# Patient Record
Sex: Female | Born: 1955 | Race: Black or African American | Hispanic: No
Health system: Midwestern US, Community
[De-identification: ages and names within clinical notes are randomized; demographics above are authoritative.]

## PROBLEM LIST (undated history)

## (undated) DIAGNOSIS — M1712 Unilateral primary osteoarthritis, left knee: Secondary | ICD-10-CM

## (undated) DIAGNOSIS — M25562 Pain in left knee: Secondary | ICD-10-CM

## (undated) DIAGNOSIS — G4730 Sleep apnea, unspecified: Secondary | ICD-10-CM

## (undated) DIAGNOSIS — R51 Headache: Secondary | ICD-10-CM

## (undated) DIAGNOSIS — M1990 Unspecified osteoarthritis, unspecified site: Secondary | ICD-10-CM

## (undated) DIAGNOSIS — G2581 Restless legs syndrome: Secondary | ICD-10-CM

## (undated) DIAGNOSIS — K219 Gastro-esophageal reflux disease without esophagitis: Secondary | ICD-10-CM

## (undated) DIAGNOSIS — K589 Irritable bowel syndrome without diarrhea: Secondary | ICD-10-CM

## (undated) DIAGNOSIS — K259 Gastric ulcer, unspecified as acute or chronic, without hemorrhage or perforation: Secondary | ICD-10-CM

## (undated) DIAGNOSIS — R519 Headache, unspecified: Secondary | ICD-10-CM

## (undated) HISTORY — PX: LAPAROSCOPIC OVARIAN CYSTECTOMY: SUR786

## (undated) HISTORY — PX: HEMORRHOID SURGERY: SHX153

## (undated) HISTORY — PX: DIAGNOSTIC LAPAROSCOPY: SUR761

## (undated) HISTORY — PX: TUBAL LIGATION: SHX77

## (undated) HISTORY — PX: JOINT REPLACEMENT: SHX530

## (undated) HISTORY — PX: DILATION AND CURETTAGE OF UTERUS: SHX78

## (undated) HISTORY — PX: KNEE ARTHROSCOPY: SHX127

---

## 2009-01-08 HISTORY — PX: GASTRIC BYPASS: SHX52

## 2012-05-10 HISTORY — PX: CHOLECYSTECTOMY: SHX55

## 2014-11-08 HISTORY — PX: SHOULDER ARTHROSCOPY W/ ROTATOR CUFF REPAIR: SHX2400

## 2015-03-04 ENCOUNTER — Emergency Department
Admit: 2015-03-04 | Disposition: A | Discharge status: Home / Self Care | Payer: Self-pay | Admitting: Emergency Medicine

## 2015-03-04 ENCOUNTER — Encounter: Payer: Self-pay | Admitting: Emergency Medicine

## 2015-03-04 LAB — CBC
HCT: 37.2 % (ref 35.0–47.0)
HGB: 12.4 g/dL (ref 12.0–16.0)
MCH: 31.2 pg (ref 26.0–34.0)
MCHC: 33.3 g/dL (ref 32.0–36.0)
MCV: 94 fL (ref 80–100)
PLATELETS: 209 10*3/uL (ref 150–440)
RBC: 3.97 10*6/uL (ref 3.80–5.20)
RDW: 13.8 % (ref 11.5–14.5)
WBC: 4.2 10*3/uL (ref 3.6–11.0)

## 2015-03-04 LAB — BASIC METABOLIC PANEL
Anion Gap: 6 — ABNORMAL LOW (ref 7–16)
BUN: 15 mg/dL
CHLORIDE: 110 mmol/L
CREATININE: 0.84 mg/dL
Calcium, Total: 8.9 mg/dL
Co2: 25 mmol/L
EGFR (African American): >60
Glucose: 81 mg/dL
Potassium: 3.8 mmol/L
Sodium: 141 mmol/L

## 2015-05-24 ENCOUNTER — Other Ambulatory Visit: Payer: Self-pay | Admitting: Neurology

## 2015-05-24 DIAGNOSIS — G35 Multiple sclerosis: Secondary | ICD-10-CM

## 2015-06-01 ENCOUNTER — Other Ambulatory Visit: Payer: Self-pay

## 2015-11-21 ENCOUNTER — Other Ambulatory Visit: Payer: Self-pay | Admitting: Internal Medicine

## 2015-11-21 ENCOUNTER — Ambulatory Visit
Admission: RE | Admit: 2015-11-21 | Discharge: 2015-11-21 | Disposition: A | Discharge status: Home / Self Care | Payer: Managed Care, Other (non HMO) | Source: Ambulatory Visit | Attending: Orthopedic Surgery | Admitting: Orthopedic Surgery

## 2015-11-21 ENCOUNTER — Encounter
Admission: RE | Admit: 2015-11-21 | Discharge: 2015-11-21 | Disposition: A | Discharge status: Home / Self Care | Payer: Managed Care, Other (non HMO) | Source: Ambulatory Visit | Attending: Orthopedic Surgery | Admitting: Orthopedic Surgery

## 2015-11-21 DIAGNOSIS — G4730 Sleep apnea, unspecified: Secondary | ICD-10-CM

## 2015-11-21 DIAGNOSIS — Z01818 Encounter for other preprocedural examination: Secondary | ICD-10-CM | POA: Diagnosis present

## 2015-11-21 DIAGNOSIS — Z01812 Encounter for preprocedural laboratory examination: Secondary | ICD-10-CM | POA: Insufficient documentation

## 2015-11-21 DIAGNOSIS — Z1239 Encounter for other screening for malignant neoplasm of breast: Secondary | ICD-10-CM

## 2015-11-21 DIAGNOSIS — Z9884 Bariatric surgery status: Secondary | ICD-10-CM | POA: Insufficient documentation

## 2015-11-21 HISTORY — DX: Sleep apnea, unspecified: G47.30

## 2015-11-21 HISTORY — DX: Gastric ulcer, unspecified as acute or chronic, without hemorrhage or perforation: K25.9

## 2015-11-21 HISTORY — DX: Irritable bowel syndrome, unspecified: K58.9

## 2015-11-21 HISTORY — DX: Headache, unspecified: R51.9

## 2015-11-21 HISTORY — DX: Unspecified osteoarthritis, unspecified site: M19.90

## 2015-11-21 HISTORY — DX: Gastro-esophageal reflux disease without esophagitis: K21.9

## 2015-11-21 HISTORY — DX: Headache: R51

## 2015-11-21 HISTORY — DX: Restless legs syndrome: G25.81

## 2015-11-21 LAB — URINALYSIS COMPLETE WITH MICROSCOPIC (ARMC ONLY)
Bilirubin Urine: NEGATIVE
GLUCOSE, UA: NEGATIVE mg/dL
KETONES UR: NEGATIVE mg/dL
NITRITE: NEGATIVE
Protein, ur: NEGATIVE mg/dL
SPECIFIC GRAVITY, URINE: 1.009 (ref 1.005–1.030)
pH: 6 (ref 5.0–8.0)

## 2015-11-21 LAB — CBC
HEMATOCRIT: 37.6 % (ref 35.0–47.0)
HEMOGLOBIN: 12.3 g/dL (ref 12.0–16.0)
MCH: 30.2 pg (ref 26.0–34.0)
MCHC: 32.7 g/dL (ref 32.0–36.0)
MCV: 92.5 fL (ref 80.0–100.0)
Platelets: 194 10*3/uL (ref 150–440)
RBC: 4.07 MIL/uL (ref 3.80–5.20)
RDW: 14.2 % (ref 11.5–14.5)
WBC: 4.3 10*3/uL (ref 3.6–11.0)

## 2015-11-21 LAB — BASIC METABOLIC PANEL
Anion gap: 7 (ref 5–15)
BUN: 13 mg/dL (ref 6–20)
CHLORIDE: 105 mmol/L (ref 101–111)
CO2: 30 mmol/L (ref 22–32)
CREATININE: 0.79 mg/dL (ref 0.44–1.00)
Calcium: 9.2 mg/dL (ref 8.9–10.3)
GFR calc Af Amer: >60 mL/min (ref >60)
GFR calc non Af Amer: >60 mL/min (ref >60)
GLUCOSE: 69 mg/dL (ref 65–99)
Potassium: 3.4 mmol/L — ABNORMAL LOW (ref 3.5–5.1)
SODIUM: 142 mmol/L (ref 135–145)

## 2015-11-21 LAB — TYPE AND SCREEN
ABO/RH(D): O POS
ANTIBODY SCREEN: NEGATIVE

## 2015-11-21 LAB — SURGICAL PCR SCREEN
MRSA, PCR: NEGATIVE
STAPHYLOCOCCUS AUREUS: POSITIVE — AB

## 2015-11-21 LAB — ABO/RH: ABO/RH(D): O POS

## 2015-11-21 LAB — PROTIME-INR
INR: 1.15
Prothrombin Time: 14.9 seconds (ref 11.4–15.0)

## 2015-11-21 LAB — APTT: aPTT: 27 seconds (ref 24–36)

## 2015-11-21 NOTE — Pre-Procedure Instructions (Signed)
AS INSTRUCTED BY DR Liliane Channel FOR MEDICAL CLEARANCE REGARDING EKG CALLED AND FAXED TO DR Martha Clan. LEFT MESSAGE FOR [facility] SURGERY COORDINATOR

## 2015-11-21 NOTE — Patient Instructions (Signed)
  Your procedure is scheduled on: November 29, 2015 (Thursday) Report to Day Surgery.[facility]) Second Floor  To find out your arrival time please call [phone] between 1PM - 3PM on November 28, 2015 (Wednesday).  Remember: Instructions that are not followed completely may result in serious medical risk, up to and including death, or upon the discretion of your surgeon and anesthesiologist your surgery may need to be rescheduled.    _x___ 1. Do not eat food or drink liquids after midnight. No gum chewing or hard candies.     ____ 2. No Alcohol for 24 hours before or after surgery.   ____ 3. Bring all medications with you on the day of surgery if instructed.    __x__ 4. Notify your doctor if there is any change in your medical condition     (cold, fever, infections).     Do not wear jewelry, make-up, hairpins, clips or nail polish.  Do not wear lotions, powders, or perfumes. You may wear deodorant.  Do not shave 48 hours prior to surgery. Men may shave face and neck.  Do not bring valuables to the hospital.    [facility] is not responsible for any belongings or valuables.               Contacts, dentures or bridgework may not be worn into surgery.  Leave your suitcase in the car. After surgery it may be brought to your room.  For patients admitted to the hospital, discharge time is determined by your                treatment team.   Patients discharged the day of surgery will not be allowed to drive home.   Please read over the following fact sheets that you were given:   MRSA Information and Surgical Site Infection Prevention   _x___ Take these medicines the morning of surgery with A SIP OF WATER:     1. Nexium (Nexium at bedtime on January 18)  2. Gabapentin  3.   4.  5.  6.  ____ Fleet Enema (as directed)   _x___ Use CHG Soap as directed  ____ Use inhalers on the day of surgery  ____ Stop metformin 2 days prior to surgery    ____ Take 1/2 of usual insulin  dose the night before surgery and none on the morning of surgery.   _x_ Stop Coumadin/Plavix/aspirin on (Stop Aspirin one week prior to surgery)  _x___ Stop Anti-inflammatories on (Tylenol ok to take for pain if needed)   __x__ Stop supplements until after surgery.  (Stop Vitamin B-12, and Biotin NOW)  ____ Bring C-Pap to the hospital.

## 2015-11-22 NOTE — Pre-Procedure Instructions (Signed)
Pt called to ask if she can continue taking her Tramadol or did she have to stop it prior to surgery.  This RN instructed pt can continue taking Tramadol, that it does not thin the blood.

## 2015-11-22 NOTE — Pre-Procedure Instructions (Signed)
Faxed results of UA and PCR screen to Dr. Martha Clan (office currently closed, no telephone notification)

## 2015-11-27 NOTE — Pre-Procedure Instructions (Signed)
CLEARED BY DR [facility] LOW RISK 11/26/15

## 2015-11-29 ENCOUNTER — Encounter: Payer: Self-pay | Admitting: *Deleted

## 2015-11-29 ENCOUNTER — Inpatient Hospital Stay
Admission: RE | Admit: 2015-11-29 | Discharge: 2015-12-03 | DRG: 470 | Disposition: A | Discharge status: Home / Self Care | Payer: Managed Care, Other (non HMO) | Source: Ambulatory Visit | Attending: Orthopedic Surgery | Admitting: Orthopedic Surgery

## 2015-11-29 ENCOUNTER — Encounter
Admission: RE | Disposition: A | Discharge status: Home / Self Care | Payer: Self-pay | Source: Ambulatory Visit | Attending: Orthopedic Surgery

## 2015-11-29 ENCOUNTER — Inpatient Hospital Stay: Payer: Managed Care, Other (non HMO)

## 2015-11-29 ENCOUNTER — Inpatient Hospital Stay: Payer: Managed Care, Other (non HMO) | Admitting: Anesthesiology

## 2015-11-29 DIAGNOSIS — Z96651 Presence of right artificial knee joint: Secondary | ICD-10-CM

## 2015-11-29 DIAGNOSIS — Z9884 Bariatric surgery status: Secondary | ICD-10-CM | POA: Diagnosis not present

## 2015-11-29 DIAGNOSIS — M1711 Unilateral primary osteoarthritis, right knee: Secondary | ICD-10-CM | POA: Diagnosis present

## 2015-11-29 DIAGNOSIS — G4730 Sleep apnea, unspecified: Secondary | ICD-10-CM | POA: Diagnosis present

## 2015-11-29 DIAGNOSIS — G2581 Restless legs syndrome: Secondary | ICD-10-CM | POA: Diagnosis present

## 2015-11-29 DIAGNOSIS — M170 Bilateral primary osteoarthritis of knee: Secondary | ICD-10-CM | POA: Diagnosis present

## 2015-11-29 DIAGNOSIS — K219 Gastro-esophageal reflux disease without esophagitis: Secondary | ICD-10-CM | POA: Diagnosis present

## 2015-11-29 DIAGNOSIS — Z8711 Personal history of peptic ulcer disease: Secondary | ICD-10-CM | POA: Diagnosis not present

## 2015-11-29 DIAGNOSIS — K589 Irritable bowel syndrome without diarrhea: Secondary | ICD-10-CM | POA: Diagnosis present

## 2015-11-29 HISTORY — PX: TOTAL KNEE ARTHROPLASTY: SHX125

## 2015-11-29 SURGERY — ARTHROPLASTY, KNEE, TOTAL
Anesthesia: Spinal | Site: Knee | Laterality: Right | Wound class: Clean

## 2015-11-29 MED ORDER — TOPIRAMATE 25 MG PO TABS
25.0000 mg | ORAL_TABLET | Freq: Two times a day (BID) | ORAL | Status: DC
  Administered 2015-11-29 – 2015-12-03 (×9): 25 mg via ORAL
  Filled 2015-11-29 (×10): qty 1

## 2015-11-29 MED ORDER — FENTANYL CITRATE (PF) 100 MCG/2ML IJ SOLN
INTRAMUSCULAR | Status: DC | PRN
  Administered 2015-11-29 (×2): 50 ug via INTRAVENOUS

## 2015-11-29 MED ORDER — SODIUM CHLORIDE 0.9 % IJ SOLN
INTRAMUSCULAR | Status: AC
  Filled 2015-11-29: qty 3

## 2015-11-29 MED ORDER — LACTATED RINGERS IV SOLN
INTRAVENOUS | Status: DC
  Administered 2015-11-29 (×3): via INTRAVENOUS

## 2015-11-29 MED ORDER — HYDROMORPHONE HCL 1 MG/ML IJ SOLN
1.0000 mg | INTRAMUSCULAR | Status: DC | PRN
  Administered 2015-11-30: 1 mg via INTRAVENOUS
  Filled 2015-11-29: qty 1

## 2015-11-29 MED ORDER — BUPIVACAINE-EPINEPHRINE (PF) 0.25% -1:200000 IJ SOLN
INTRAMUSCULAR | Status: AC
  Filled 2015-11-29: qty 30

## 2015-11-29 MED ORDER — BIOTIN 5000 MCG SL SUBL: 5000.0000 ug | SUBLINGUAL_TABLET | Freq: Every day | SUBLINGUAL | Status: DC

## 2015-11-29 MED ORDER — PHENOL 1.4 % MT LIQD: 1.0000 | OROMUCOSAL | Status: DC | PRN

## 2015-11-29 MED ORDER — VITAMIN B-12 1000 MCG PO TABS
1000.0000 ug | ORAL_TABLET | Freq: Every day | ORAL | Status: DC
  Administered 2015-11-30 – 2015-12-03 (×4): 1000 ug via ORAL
  Filled 2015-11-29 (×4): qty 1

## 2015-11-29 MED ORDER — PHENYLEPHRINE HCL 10 MG/ML IJ SOLN
INTRAMUSCULAR | Status: DC | PRN
  Administered 2015-11-29 (×4): 100 ug via INTRAVENOUS

## 2015-11-29 MED ORDER — PROPOFOL 500 MG/50ML IV EMUL
INTRAVENOUS | Status: DC | PRN
  Administered 2015-11-29: 75 ug/kg/min via INTRAVENOUS

## 2015-11-29 MED ORDER — METHOCARBAMOL 500 MG PO TABS
500.0000 mg | ORAL_TABLET | Freq: Four times a day (QID) | ORAL | Status: DC | PRN
  Administered 2015-11-29 – 2015-12-03 (×9): 500 mg via ORAL
  Filled 2015-11-29 (×10): qty 1

## 2015-11-29 MED ORDER — BUPIVACAINE HCL (PF) 0.5 % IJ SOLN
INTRAMUSCULAR | Status: DC | PRN
  Administered 2015-11-29: 2.5 mL

## 2015-11-29 MED ORDER — ALUM & MAG HYDROXIDE-SIMETH 200-200-20 MG/5ML PO SUSP: 30.0000 mL | ORAL | Status: DC | PRN

## 2015-11-29 MED ORDER — ONDANSETRON HCL 4 MG/2ML IJ SOLN: 4.0000 mg | Freq: Four times a day (QID) | INTRAMUSCULAR | Status: DC | PRN

## 2015-11-29 MED ORDER — NEOMYCIN-POLYMYXIN B GU 40-200000 IR SOLN
Status: AC
  Filled 2015-11-29: qty 20

## 2015-11-29 MED ORDER — METOCLOPRAMIDE HCL 5 MG/ML IJ SOLN: 5.0000 mg | Freq: Three times a day (TID) | INTRAMUSCULAR | Status: DC | PRN

## 2015-11-29 MED ORDER — OXYCODONE HCL 5 MG PO TABS
10.0000 mg | ORAL_TABLET | ORAL | Status: DC | PRN
  Administered 2015-11-29 – 2015-11-30 (×6): 10 mg via ORAL
  Filled 2015-11-29 (×7): qty 2

## 2015-11-29 MED ORDER — CEFAZOLIN SODIUM-DEXTROSE 2-3 GM-% IV SOLR
2.0000 g | Freq: Once | INTRAVENOUS | Status: AC
  Administered 2015-11-29: 2 g via INTRAVENOUS

## 2015-11-29 MED ORDER — SODIUM CHLORIDE 0.9 % IV SOLN
Freq: Once | INTRAVENOUS | Status: AC
  Administered 2015-11-29: 14:00:00 via INTRAVENOUS

## 2015-11-29 MED ORDER — CEFAZOLIN SODIUM-DEXTROSE 2-3 GM-% IV SOLR
INTRAVENOUS | Status: AC
  Filled 2015-11-29: qty 50

## 2015-11-29 MED ORDER — ONDANSETRON HCL 4 MG PO TABS: 4.0000 mg | ORAL_TABLET | Freq: Four times a day (QID) | ORAL | Status: DC | PRN

## 2015-11-29 MED ORDER — MIDAZOLAM HCL 5 MG/5ML IJ SOLN
INTRAMUSCULAR | Status: DC | PRN
  Administered 2015-11-29: 0.5 mg via INTRAVENOUS
  Administered 2015-11-29: 1 mg via INTRAVENOUS
  Administered 2015-11-29: 0.5 mg via INTRAVENOUS

## 2015-11-29 MED ORDER — ACETAMINOPHEN 325 MG PO TABS: 650.0000 mg | ORAL_TABLET | Freq: Four times a day (QID) | ORAL | Status: DC | PRN

## 2015-11-29 MED ORDER — SODIUM CHLORIDE 0.9 % IJ SOLN
INTRAMUSCULAR | Status: AC
  Filled 2015-11-29: qty 50

## 2015-11-29 MED ORDER — VITAMIN B-12 5000 MCG SL SUBL: 5000.0000 ug | SUBLINGUAL_TABLET | Freq: Every day | SUBLINGUAL | Status: DC

## 2015-11-29 MED ORDER — DIPHENHYDRAMINE HCL 12.5 MG/5ML PO ELIX: 12.5000 mg | ORAL_SOLUTION | ORAL | Status: DC | PRN

## 2015-11-29 MED ORDER — SODIUM CHLORIDE 0.9 % IV SOLN
INTRAVENOUS | Status: DC | PRN
  Administered 2015-11-29: 120 mL

## 2015-11-29 MED ORDER — GABAPENTIN 600 MG PO TABS
600.0000 mg | ORAL_TABLET | Freq: Three times a day (TID) | ORAL | Status: DC
  Administered 2015-11-29 – 2015-12-03 (×12): 600 mg via ORAL
  Filled 2015-11-29 (×12): qty 1

## 2015-11-29 MED ORDER — METOCLOPRAMIDE HCL 5 MG PO TABS: 5.0000 mg | ORAL_TABLET | Freq: Three times a day (TID) | ORAL | Status: DC | PRN

## 2015-11-29 MED ORDER — SUMATRIPTAN SUCCINATE 50 MG PO TABS
100.0000 mg | ORAL_TABLET | ORAL | Status: DC | PRN
  Administered 2015-12-02: 100 mg via ORAL
  Filled 2015-11-29: qty 2

## 2015-11-29 MED ORDER — BUPIVACAINE LIPOSOME 1.3 % IJ SUSP
INTRAMUSCULAR | Status: AC
  Filled 2015-11-29: qty 20

## 2015-11-29 MED ORDER — BISACODYL 5 MG PO TBEC
5.0000 mg | DELAYED_RELEASE_TABLET | Freq: Every day | ORAL | Status: DC | PRN
  Administered 2015-12-01: 5 mg via ORAL
  Filled 2015-11-29: qty 1

## 2015-11-29 MED ORDER — MENTHOL 3 MG MT LOZG: 1.0000 | LOZENGE | OROMUCOSAL | Status: DC | PRN

## 2015-11-29 MED ORDER — CLINDAMYCIN PHOSPHATE 600 MG/50ML IV SOLN: 600.0000 mg | Freq: Once | INTRAVENOUS | Status: DC

## 2015-11-29 MED ORDER — METHOCARBAMOL 1000 MG/10ML IJ SOLN
500.0000 mg | Freq: Four times a day (QID) | INTRAVENOUS | Status: DC | PRN
  Filled 2015-11-29: qty 5

## 2015-11-29 MED ORDER — DOCUSATE SODIUM 100 MG PO CAPS
100.0000 mg | ORAL_CAPSULE | Freq: Two times a day (BID) | ORAL | Status: DC
  Administered 2015-11-29 – 2015-12-03 (×8): 100 mg via ORAL
  Filled 2015-11-29 (×8): qty 1

## 2015-11-29 MED ORDER — SODIUM CHLORIDE 0.9 % IV SOLN
INTRAVENOUS | Status: DC
  Administered 2015-11-29: 15:00:00 via INTRAVENOUS

## 2015-11-29 MED ORDER — MIRABEGRON ER 25 MG PO TB24: 50.0000 mg | ORAL_TABLET | ORAL | Status: DC | PRN

## 2015-11-29 MED ORDER — ACETAMINOPHEN 650 MG RE SUPP: 650.0000 mg | Freq: Four times a day (QID) | RECTAL | Status: DC | PRN

## 2015-11-29 MED ORDER — CELECOXIB 200 MG PO CAPS
200.0000 mg | ORAL_CAPSULE | Freq: Two times a day (BID) | ORAL | Status: DC
  Administered 2015-11-29: 200 mg via ORAL
  Filled 2015-11-29: qty 1

## 2015-11-29 MED ORDER — ONDANSETRON HCL 4 MG/2ML IJ SOLN
INTRAMUSCULAR | Status: DC | PRN
  Administered 2015-11-29: 4 mg via INTRAVENOUS

## 2015-11-29 MED ORDER — MUPIROCIN 2 % EX OINT
TOPICAL_OINTMENT | Freq: Two times a day (BID) | CUTANEOUS | Status: DC
  Administered 2015-11-29 – 2015-12-03 (×9): via NASAL
  Filled 2015-11-29: qty 22

## 2015-11-29 MED ORDER — ASPIRIN EC 81 MG PO TBEC
81.0000 mg | DELAYED_RELEASE_TABLET | Freq: Every day | ORAL | Status: DC
  Administered 2015-11-30 – 2015-12-03 (×4): 81 mg via ORAL
  Filled 2015-11-29 (×4): qty 1

## 2015-11-29 MED ORDER — TETRACAINE HCL 1 % IJ SOLN
INTRAMUSCULAR | Status: DC | PRN
  Administered 2015-11-29: 2.5 mg via INTRASPINAL

## 2015-11-29 MED ORDER — MORPHINE SULFATE (PF) 4 MG/ML IV SOLN
INTRAVENOUS | Status: AC
  Filled 2015-11-29: qty 1

## 2015-11-29 MED ORDER — ENOXAPARIN SODIUM 30 MG/0.3ML ~~LOC~~ SOLN
30.0000 mg | Freq: Two times a day (BID) | SUBCUTANEOUS | Status: DC
  Administered 2015-11-30 – 2015-12-03 (×7): 30 mg via SUBCUTANEOUS
  Filled 2015-11-29 (×8): qty 0.3

## 2015-11-29 MED ORDER — FENTANYL CITRATE (PF) 100 MCG/2ML IJ SOLN: 25.0000 ug | INTRAMUSCULAR | Status: DC | PRN

## 2015-11-29 MED ORDER — LIDOCAINE HCL (PF) 1 % IJ SOLN
INTRAMUSCULAR | Status: DC | PRN
  Administered 2015-11-29: 30 mg

## 2015-11-29 MED ORDER — ACETAMINOPHEN 10 MG/ML IV SOLN
INTRAVENOUS | Status: AC
  Filled 2015-11-29: qty 100

## 2015-11-29 MED ORDER — VITAMIN D 1000 UNITS PO TABS
1000.0000 [IU] | ORAL_TABLET | Freq: Every day | ORAL | Status: DC
  Administered 2015-11-30 – 2015-12-03 (×4): 1000 [IU] via ORAL
  Filled 2015-11-29 (×4): qty 1

## 2015-11-29 MED ORDER — ACETAMINOPHEN 10 MG/ML IV SOLN
INTRAVENOUS | Status: DC | PRN
  Administered 2015-11-29: 1000 mg via INTRAVENOUS

## 2015-11-29 MED ORDER — FLUTICASONE PROPIONATE 50 MCG/ACT NA SUSP
2.0000 | Freq: Every day | NASAL | Status: DC
  Filled 2015-11-29: qty 16

## 2015-11-29 MED ORDER — ONDANSETRON HCL 4 MG/2ML IJ SOLN: 4.0000 mg | Freq: Once | INTRAMUSCULAR | Status: DC | PRN

## 2015-11-29 MED ORDER — CLINDAMYCIN PHOSPHATE 600 MG/50ML IV SOLN
INTRAVENOUS | Status: AC
  Administered 2015-11-29: 600 mg via INTRAVENOUS
  Filled 2015-11-29: qty 50

## 2015-11-29 MED ORDER — MAGNESIUM HYDROXIDE 400 MG/5ML PO SUSP
30.0000 mL | Freq: Every day | ORAL | Status: DC | PRN
  Administered 2015-11-30: 30 mL via ORAL
  Filled 2015-11-29: qty 30

## 2015-11-29 MED ORDER — LIDOCAINE HCL 2 % EX GEL
CUTANEOUS | Status: DC | PRN
  Administered 2015-11-29: 1 via TOPICAL

## 2015-11-29 MED ORDER — KETOROLAC TROMETHAMINE 30 MG/ML IJ SOLN
INTRAMUSCULAR | Status: DC | PRN
  Administered 2015-11-29: 30 mg via INTRAVENOUS

## 2015-11-29 MED ORDER — CEFAZOLIN SODIUM-DEXTROSE 2-3 GM-% IV SOLR
2.0000 g | Freq: Four times a day (QID) | INTRAVENOUS | Status: AC
  Administered 2015-11-29 (×2): 2 g via INTRAVENOUS
  Filled 2015-11-29 (×2): qty 50

## 2015-11-29 MED ORDER — NEOMYCIN-POLYMYXIN B GU 40-200000 IR SOLN
Status: DC | PRN
  Administered 2015-11-29: 1 mL

## 2015-11-29 MED ORDER — FLEET ENEMA 7-19 GM/118ML RE ENEM
1.0000 | ENEMA | Freq: Once | RECTAL | Status: AC | PRN
  Administered 2015-12-02: 1 via RECTAL

## 2015-11-29 MED ORDER — EPHEDRINE SULFATE 50 MG/ML IJ SOLN
INTRAMUSCULAR | Status: DC | PRN
  Administered 2015-11-29: 5 mg via INTRAVENOUS

## 2015-11-29 MED ORDER — PANTOPRAZOLE SODIUM 40 MG PO TBEC
40.0000 mg | DELAYED_RELEASE_TABLET | Freq: Every day | ORAL | Status: DC
  Administered 2015-11-29 – 2015-12-03 (×5): 40 mg via ORAL
  Filled 2015-11-29 (×5): qty 1

## 2015-11-29 SURGICAL SUPPLY — 62 items
AUTOTRANSFUS HAS 1/8 (MISCELLANEOUS) ×3
BAG DECANTER FOR FLEXI CONT (MISCELLANEOUS) ×3 IMPLANT
BLADE DEBAKEY 8.0 (BLADE) ×2 IMPLANT
BLADE DEBAKEY 8.0MM (BLADE) ×1
BLADE SAW 1 (BLADE) ×3 IMPLANT
BLADE SAW 1/2 (BLADE) ×3 IMPLANT
BLADE SURG 15 STRL LF DISP TIS (BLADE) ×1 IMPLANT
BLADE SURG 15 STRL SS (BLADE) ×2
CANISTER SUCT 1200ML W/VALVE (MISCELLANEOUS) ×6 IMPLANT
CAP KNEE TOTAL 3 SIGMA ×3 IMPLANT
CATH TRAY METER 16FR LF (MISCELLANEOUS) ×3 IMPLANT
CEMENT HV SMART SET (Cement) ×6 IMPLANT
CLOSURE WOUND 1/2 X4 (GAUZE/BANDAGES/DRESSINGS) ×2
COOLER POLAR GLACIER W/PUMP (MISCELLANEOUS) ×3 IMPLANT
DRAPE IMP U-DRAPE 54X76 (DRAPES) ×3 IMPLANT
DRAPE INCISE IOBAN 66X60 STRL (DRAPES) ×3 IMPLANT
DRAPE SHEET LG 3/4 BI-LAMINATE (DRAPES) ×3 IMPLANT
DRAPE SURG 17X11 SM STRL (DRAPES) ×6 IMPLANT
DRSG OPSITE POSTOP 4X12 (GAUZE/BANDAGES/DRESSINGS) ×6 IMPLANT
DRSG OPSITE POSTOP 4X14 (GAUZE/BANDAGES/DRESSINGS) ×3 IMPLANT
DURAPREP 26ML APPLICATOR (WOUND CARE) ×9 IMPLANT
ELECT REM PT RETURN 9FT ADLT (ELECTROSURGICAL) ×3
ELECTRODE REM PT RTRN 9FT ADLT (ELECTROSURGICAL) ×1 IMPLANT
GAUZE PETRO XEROFOAM 1X8 (MISCELLANEOUS) ×6 IMPLANT
GAUZE SPONGE 4X4 12PLY STRL (GAUZE/BANDAGES/DRESSINGS) ×3 IMPLANT
GLOVE BIOGEL PI IND STRL 9 (GLOVE) ×1 IMPLANT
GLOVE BIOGEL PI INDICATOR 9 (GLOVE) ×2
GLOVE SURG 9.0 ORTHO LTXF (GLOVE) ×9 IMPLANT
GOWN STRL REUS TWL 2XL XL LVL4 (GOWN DISPOSABLE) ×3 IMPLANT
GOWN STRL REUS W/ TWL LRG LVL3 (GOWN DISPOSABLE) ×4 IMPLANT
GOWN STRL REUS W/TWL LRG LVL3 (GOWN DISPOSABLE) ×8
GOWN STRL REUS W/TWL XL LVL4 (GOWN DISPOSABLE) ×3 IMPLANT
HANDPIECE SUCTION TUBG SURGILV (MISCELLANEOUS) ×3 IMPLANT
IMMBOLIZER KNEE 19 BLUE UNIV (SOFTGOODS) ×3 IMPLANT
IV NS 100ML SINGLE PACK (IV SOLUTION) ×3 IMPLANT
KIT RM TURNOVER STRD PROC AR (KITS) ×3 IMPLANT
NDL SAFETY 18GX1.5 (NEEDLE) ×3 IMPLANT
NDL SAFETY 22GX1.5 (NEEDLE) ×3 IMPLANT
NEEDLE SPNL 20GX3.5 QUINCKE YW (NEEDLE) ×3 IMPLANT
NS IRRIG 1000ML POUR BTL (IV SOLUTION) ×3 IMPLANT
PACK TOTAL KNEE (MISCELLANEOUS) ×3 IMPLANT
PAD PREP 24X41 OB/GYN DISP (PERSONAL CARE ITEMS) ×3 IMPLANT
PAD WRAPON POLAR KNEE (MISCELLANEOUS) ×1 IMPLANT
SOL .9 NS 3000ML IRR  AL (IV SOLUTION) ×2
SOL .9 NS 3000ML IRR UROMATIC (IV SOLUTION) ×1 IMPLANT
SPONGE DRAIN TRACH 4X4 STRL 2S (GAUZE/BANDAGES/DRESSINGS) ×3 IMPLANT
SPONGE LAP 18X18 5 PK (GAUZE/BANDAGES/DRESSINGS) IMPLANT
STAPLER SKIN PROX 35W (STAPLE) ×3 IMPLANT
STRIP CLOSURE SKIN 1/2X4 (GAUZE/BANDAGES/DRESSINGS) ×4 IMPLANT
SUCTION FRAZIER HANDLE 10FR (MISCELLANEOUS) ×2
SUCTION TUBE FRAZIER 10FR DISP (MISCELLANEOUS) ×1 IMPLANT
SUT ETHIBOND NAB CT1 #1 30IN (SUTURE) ×6 IMPLANT
SUT MNCRL AB 3-0 PS2 27 (SUTURE) ×6 IMPLANT
SUT VIC AB 0 CT1 36 (SUTURE) ×3 IMPLANT
SUT VIC AB 2-0 CT1 (SUTURE) ×6 IMPLANT
SYR 20CC LL (SYRINGE) ×3 IMPLANT
SYR 30ML LL (SYRINGE) ×3 IMPLANT
SYR 50ML LL SCALE MARK (SYRINGE) ×3 IMPLANT
SYSTEM AUTOTRANSFUS DUAL TROCR (MISCELLANEOUS) ×1 IMPLANT
TOWER CARTRIDGE SMART MIX (DISPOSABLE) ×3 IMPLANT
TUBE SUCT KAM VAC (TUBING) ×3 IMPLANT
WRAPON POLAR PAD KNEE (MISCELLANEOUS) ×3

## 2015-11-29 NOTE — Evaluation (Signed)
Physical Therapy Evaluation Patient Details Name: Deborah Reynolds MRN: [id number] DOB: 20-Apr-1956 Today's Date: 11/29/2015   History of Present Illness  Pt underwent R TKR without reported post-op complications. She is POD#0 at time of evaluation. No reported falls in the last 12 months.  Clinical Impression  Pt demonstrates good strength and stability with physical therapy for POD#0. AAOM is also good for day of surgery as well. Pt demonstrates good strength with bed mobility, transfers, and limited ambulation. She is able to transfer from bed to recliner with min guard only. Good sequencing with walker and pt able to maintain PWB restrictions. Pt will benefit from skilled PT services to address deficits in strength, balance, and mobility in order to return to full function at home.     Follow Up Recommendations Home health PT    Equipment Recommendations  Rolling walker with 5" wheels    Recommendations for Other Services       Precautions / Restrictions Precautions Precautions: Knee Precaution Booklet Issued: Yes (comment) Restrictions Weight Bearing Restrictions: Yes RLE Weight Bearing: Partial weight bearing      Mobility  Bed Mobility Overal bed mobility: Needs Assistance Bed Mobility: Supine to Sit     Supine to sit: Min assist     General bed mobility comments: Pt requires minA for UE support when moving to sitting upright. Pt able to scoot up to EOB without assist. Good sequencing noted  Transfers Overall transfer level: Needs assistance Equipment used: Rolling walker (2 wheeled) Transfers: Sit to/from Stand Sit to Stand: Min guard         General transfer comment: Pt with decreased weight shift to RLE during transfer. Increased pain but able to come to upright standing without assist from therapist. Once upright cues provided for full hip extension in standing. Pt able to increase weight shift to RLE once standing. Able to maintain PWB  status  Ambulation/Gait Ambulation/Gait assistance: Min guard Ambulation Distance (Feet): 3 Feet Assistive device: Rolling walker (2 wheeled) Gait Pattern/deviations: Step-to pattern;Decreased stance time - right;Decreased weight shift to right Gait velocity: Decreased Gait velocity interpretation: <1.8 ft/sec, indicative of risk for recurrent falls General Gait Details: Pt provided education regarding proper sequencing with walker and PWB status. Pt with decreased weight shift to RLE but good stability and strength noted. Pt reports increased pain in standing on RLe  Stairs            Wheelchair Mobility    Modified Rankin (Stroke Patients Only)       Balance Overall balance assessment: Needs assistance   Sitting balance-Leahy Scale: Normal       Standing balance-Leahy Scale: Fair                               Pertinent Vitals/Pain Pain Assessment: 0-10 Pain Score: 8  Pain Location: R knee Pain Descriptors / Indicators: Throbbing Pain Intervention(s): Limited activity within patient's tolerance;Monitored during session;Premedicated before session    Home Living Family/patient expects to be discharged to:: Private residence Living Arrangements: Children Available Help at Discharge: Family Type of Home: Apartment Home Access: Stairs to enter Entrance Stairs-Rails: None Entrance Stairs-Number of Steps: 4 Home Layout: One level Home Equipment: Cane - quad;Grab bars - tub/shower;Grab bars - toilet      Prior Function Level of Independence: Independent         Comments: Limited community ambulator with mult-point cane     Hand Dominance  Dominant Hand: Right    Extremity/Trunk Assessment   Upper Extremity Assessment: Overall WFL for tasks assessed           Lower Extremity Assessment: RLE deficits/detail RLE Deficits / Details: Pt demonstrates independent SLR and SAQ on RLE without assist. Full DF/PF. Pt reports full sensation in  RLE. LLE strength is grossly WFL       Communication   Communication: No difficulties  Cognition Arousal/Alertness: Awake/alert Behavior During Therapy: WFL for tasks assessed/performed Overall Cognitive Status: Within Functional Limits for tasks assessed                      General Comments      Exercises Total Joint Exercises Ankle Circles/Pumps: Strengthening;Both;10 reps;Supine Quad Sets: Strengthening;Both;10 reps;Supine Gluteal Sets: Strengthening;Both;10 reps;Supine Towel Squeeze: Strengthening;Both;10 reps;Supine Short Arc Quad: Strengthening;Right;10 reps;Supine Heel Slides: Strengthening;Right;10 reps;Supine Hip ABduction/ADduction: Strengthening;Right;10 reps;Supine Straight Leg Raises: Strengthening;Right;10 reps;Supine Goniometric ROM: -7-72 degrees AAROM, pain limited      Assessment/Plan    PT Assessment Patient needs continued PT services  PT Diagnosis Difficulty walking;Abnormality of gait;Generalized weakness;Acute pain   PT Problem List Decreased strength;Decreased range of motion;Decreased balance;Decreased mobility;Decreased knowledge of use of DME;Pain;Obesity  PT Treatment Interventions     PT Goals (Current goals can be found in the Care Plan section) Acute Rehab PT Goals Patient Stated Goal: Improve functioning with less pain PT Goal Formulation: With patient Time For Goal Achievement: 12/13/15 Potential to Achieve Goals: Good    Frequency BID   Barriers to discharge Inaccessible home environment 4 steps to enter, no rails    Co-evaluation               End of Session Equipment Utilized During Treatment: Gait belt Activity Tolerance: Patient tolerated treatment well Patient left: in chair;with call bell/phone within reach;with chair alarm set;with SCD's reapplied (towel roll under heel, polar care in place) Nurse Communication: Mobility status         Time: 0045-9977 PT Time Calculation (min) (ACUTE ONLY): 26  min   Charges:   PT Evaluation $PT Eval Low Complexity: 1 Procedure PT Treatments $Therapeutic Exercise: 8-22 mins   PT G Codes:       Sharalyn Ink Debborah Alonge PT, DPT   Nimah Uphoff 11/29/2015, 5:13 PM

## 2015-11-29 NOTE — Progress Notes (Signed)
  Subjective:  POST-OP CHECK:  Patient reports right knee pain as marked.  Patient recently given oxycodone for pain.    Objective:   VITALS:   Filed Vitals:   11/29/15 1451 11/29/15 1519 11/29/15 1550 11/29/15 1707  BP: 92/77 120/63 111/71 109/69  Pulse: 74 66 67 72  Temp: 96.8 F (36 C) 97.4 F (36.3 C) 97.4 F (36.3 C) 97.9 F (36.6 C)  TempSrc: Axillary Oral Oral Oral  Resp: 16  18 18   Height:      Weight:      SpO2: 100% 100% 100% 100%    PHYSICAL EXAM: Patient lying in bed. HOB at 30 degrees. No acute distress. Right knee exam: Neurologically intact Neurovascular intact Sensation intact distally Intact pulses distally Dorsiflexion/Plantar flexion intact Compartment soft  LABS  No results found for this or any previous visit (from the past 24 hour(s)).  Dg Knee 1-2 Views Right  11/29/2015  CLINICAL DATA:  Status post total knee replacement EXAM: RIGHT KNEE - 1-2 VIEW COMPARISON:  None. FINDINGS: Frontal and lateral views were obtained. The patient is status post total knee replacement right with femoral and tibial prosthetic components appearing well-seated. No fracture or dislocation. Drains are present within the knee joint. Soft tissue air is an expected postoperative finding. IMPRESSION: Prosthetic components appear well seated. No acute fracture or dislocation. Electronically Signed   By: Bretta Bang III M.D.   On: 11/29/2015 11:54    Assessment/Plan: Day of Surgery   Active Problems:   History of total right knee replacement  Patient stable post-op.  RN reports patient's pain well controlled this afternoon.  Patient to complete 24 hours of post-op antibiotics.  Lovenox to start tomorrow.  Encourage IS.  I have reviewed the post-op xrays which demonstrate components well-positioned and no evidence of fracture or dislocation.  PT to start in AM.  CPM to start this evening.    Juanell Fairly , MD 11/29/2015, 5:44 PM

## 2015-11-29 NOTE — Care Management (Addendum)
Admitted to this facility post Total Knee Replacement today 11/29/15. Lives with son, Florentina Jenny, [phone]). Last seen Dr. Leotis Shames last week.  Did attend Joint Classes. No home Health in the past. No skilled nursing facility. Does have a cane in the home. Would like a rolling walker. Would like to go home when stable. No Home Health preference. Takes care of all basic and instrumental activities of daily living herself, drives. Pharmacy is located on [address] in [place]. Physical therapy evaluation pending. Gwenette Greet RN MSN CCM Care Management [phone]

## 2015-11-29 NOTE — Transfer of Care (Signed)
Immediate Anesthesia Transfer of Care Note  Patient: Deborah Reynolds  Procedure(s) Performed: Procedure(s): TOTAL KNEE ARTHROPLASTY (Right)  Patient Location: PACU  Anesthesia Type:Spinal  Level of Consciousness: awake, oriented and patient cooperative  Airway & Oxygen Therapy: Patient Spontanous Breathing and Patient connected to face mask oxygen  Post-op Assessment: Report given to RN and Post -op Vital signs reviewed and stable  Post vital signs: Reviewed and stable  Last Vitals:  Filed Vitals:   11/29/15 0627  BP: 130/83  Pulse: 73  Temp: 36.4 C  Resp: 14    Complications: No apparent anesthesia complications

## 2015-11-29 NOTE — Progress Notes (Signed)
RN SPOKE WITH DR Martha Clan . PT DOES NOT WANT TO TAKE CELEBREX D/T BLEEDING TENDENCIES. MD REPORTS D/C MED

## 2015-11-29 NOTE — Anesthesia Procedure Notes (Signed)
Spinal Patient location during procedure: OR Staffing Anesthesiologist: Alvin Critchley Resident/CRNA: Raynaldo Falco, Marcello Moores Performed by: anesthesiologist  Preanesthetic Checklist Completed: patient identified, site marked, surgical consent, pre-op evaluation, timeout performed, IV checked, risks and benefits discussed and monitors and equipment checked Spinal Block Patient position: sitting Prep: Betadine and site prepped and draped Patient monitoring: heart rate, continuous pulse ox, blood pressure and cardiac monitor Approach: midline Location: L4-5 Injection technique: single-shot Needle Needle type: Introducer and Quincke  Needle gauge: 25 G Needle length: 9 cm Additional Notes Negative paresthesia. Negative blood return. Positive free-flowing CSF. Expiration date of kit checked and confirmed. Patient tolerated procedure well, without complications.

## 2015-11-29 NOTE — H&P (Signed)
The patient has been re-examined, and the chart reviewed, and there have been no interval changes to the documented history and physical.    The risks, benefits, and alternatives have been discussed at length, and the patient is willing to proceed.   

## 2015-11-29 NOTE — Op Note (Signed)
DATE OF SURGERY:  11/29/2015 TIME: 10:37 AM  PATIENT NAME:  Deborah Reynolds   AGE: 60 y.o.    PRE-OPERATIVE DIAGNOSIS:  RIGHT PRIMARY OSTEOARTHRITIS  POST-OPERATIVE DIAGNOSIS:  Same  PROCEDURE:  Procedure(s): RIGHT TOTAL KNEE ARTHROPLASTY  SURGEON:  Juanell Fairly, MD   ASSISTANT:  Deeann Saint, MD                        Jamison Neighbor, [place] PA Student  OPERATIVE IMPLANTS: Depuy PFC Sigma, Posterior Stabilized Femural component size 4, Tibia size rotating platform component size 4, Patella polyethylene 3-peg oval button size 41, with a 12.5 mm polyethylene insert.   PREOPERATIVE INDICATIONS:  [name] is an 60 y.o. female who has a diagnosis of right knee osteoarthritis and elected for a right total knee arthroplasty after failing nonoperative treatment and has significant impairment of their activities of daily living.  Radiographs have demonstrated tricompartmental osteoarthritis joint space narrowing, osteophytes, subchondral sclerosis and cyst formation.  The risks, benefits, and alternatives were discussed at length including but not limited to the risks of infection, bleeding, nerve or blood vessel injury, knee stiffness, fracture, dislocation, loosening or failure of the hardware and the need for further surgery. Medical risks include but not limited to DVT and pulmonary embolism, myocardial infarction, stroke, pneumonia, respiratory failure and death. I discussed these risks with the patient in my office prior to the date of surgery. They understood these risks and were willing to proceed.  OPERATIVE FINDINGS AND UNIQUE ASPECTS OF THE CASE:  Tricompartmental osteoarthritis, right knee   OPERATIVE DESCRIPTION:  The patient was brought to the operative room and placed in a supine position after undergoing placement of a spinal anesthetic.  A Foley catheter was placed.  IV antibiotics were given. Patient received ancef 2 g and clindamycin. The lower extremity was  prepped and draped in the usual sterile fashion.  A time out was performed to verify the patient's name, date of birth, medical record number, correct site of surgery and correct procedure to be performed. The timeout was also used to confirm the patient received antibiotics and that appropriate instruments, implants and radiographs studies were available in the room.  The leg was elevated and exsanguinated with an Esmarch and the tourniquet was inflated to 275 mmHg for 104 minutes..  A midline incision was made over the right knee. Full-thickness skin flaps were developed. A medial parapatellar arthrotomy was then made and the patella everted and the knee was brought into 90 of flexion. Hoffa's fat pad along with the cruciate ligaments and medial and lateral menisci were resected.   The distal femoral intramedullary canal was opened with a drill and the intramedullary distal femoral cutting jig was inserted into the femoral canal pinned into position. It was set at 5 degrees resecting 10 mm off the distal femur.  Care was taken to protect the collateral ligaments during distal femoral resection.  The distal femoral resection was performed with an oscillating saw. The femoral cutting guide was then removed.  The extramedullary tibial cutting guide was then placed using the anterior tibial crest and second ray of the foot as a references.  The tibial cutting guide was adjusted to allow for appropriate posterior slope.  The tibial cutting block was pinned into position. The slotted stylus was used to measure the proximal tibial resection of 10 mm off the high lateral side.  The tibial long rod alignment guide was then used to confirm position of the  cutting block. A third cross pin through the tibial cutting block was then drilled into position to allow for rotational stability. Care was taken during the tibial resection to protect the medial and collateral ligaments.  The resected tibial bone was removed along  with the posterior horns of the menisci.  The PCL was sacrificed.  Extension gap was measured with a spacer block and alignment and extension was confirmed using a long alignment rod.  The attention was then turned back to the femur. The posterior referencing distal femoral sizing guide was applied to the distal femur.  The femur was sized to be a 4. Rotation of the referencing guide was checked with the epicondylar axis and Whitesides line. Then the 4-in-1 cutting jig was then applied to the distal femur. A stylus was used to confirm that the anterior femur would not be notched.   Then the anterior, posterior and chamfer femoral cuts were then made with an oscillating saw.  All posterior osteophytes were removed.  The flexion gap was then measured with a flexion spacer block and long alignment rod and was found to be symmetric with the extension gap and perpendicular to mechanical axis of the tibia.  The distal femoral preparation was completed by performing the posterior stabilized box cut using the cutting block. The entry site for the intramedullary femoral guide was filled with autologous bone graft from bone previously resected earlier in the case.  The proximal tibia plateau was then sized with trial trays. The best coverage was achieved with a size 4. This tibial tray was then pinned into position. The proximal tibia was then prepared with the reamer and keel punch.  After tibial preparation was completed, all trial components were inserted with polyethylene trials.  The knee was found to have excellent balance and full motion with a size 4 mm tibial polyethylene insert..    The attention was then turned to preparation of the patella. The thickness of the patella was measured with a caliper, the diameter measured with the patella templates.  The patella resection was then made with an oscillating saw using the patella cutting guide.  The final patellar thickness 12.5 mm.  3 peg holes for the patella  component were then drilled. The trial patella was then placed. Knee was taken through a full range of motion and deemed to be stable with the trial components. All trial components were then removed. The knee capsule was then injected with Exparel. The joint was copiously irrigated with pulse lavage.  The final total knee arthroplasty components were then cemented into place with a 12.5 mm trial polyethylene insert and all excess methylmethacrylate was removed.  The joint was again copiously irrigated. After the cement had hardened the knee was again taken through a full range of motion. It was felt to be most stable with the 12.5 mm tibial polyethylene insert. The actual tibial polyethylene insert was then placed.   The knee was taken through a range of motion and the patella tracked well and the knee was again irrigated copiously.    An Autovac drain was placed. The 2 drain limbs were brought out through the superior lateral knee. The medial arthrotomy was closed with #1 Ethibond. The subcutaneous tissue closed with 0 and 2-0 vicryl, and skin approximated with staples.  A dry sterile and compressive dressing was applied.  A Polar Care was applied to the operative knee along with TENS unit pads and a knee immobilizer.  The patient was awakened and brought  to the PACU in stable and satisfactory condition.  All sharp, lap and instrument counts were correct at the conclusion the case. I spoke with the patient'sson in the postop consultation room to let him know the case it done without complication and the patient was stable in recovery room.   Total tourniquet time was 104 minutes.

## 2015-11-29 NOTE — Anesthesia Preprocedure Evaluation (Signed)
Anesthesia Evaluation  Patient identified by MRN, date of birth, ID band Patient awake    Reviewed: Allergy & Precautions, NPO status , Patient's Chart, lab work & pertinent test results  Airway Mallampati: III  TM Distance: <3 FB Neck ROM: Full    Dental  (+) Chipped   Pulmonary sleep apnea ,    Pulmonary exam normal        Cardiovascular negative cardio ROS Normal cardiovascular exam     Neuro/Psych  Headaches, negative psych ROS   GI/Hepatic Neg liver ROS, PUD, GERD  Medicated and Controlled,  Endo/Other  negative endocrine ROS  Renal/GU negative Renal ROS  negative genitourinary   Musculoskeletal  (+) Arthritis , Osteoarthritis,    Abdominal Normal abdominal exam  (+)   Peds negative pediatric ROS (+)  Hematology negative hematology ROS (+)   Anesthesia Other Findings   Reproductive/Obstetrics                             Anesthesia Physical Anesthesia Plan  ASA: II  Anesthesia Plan: Spinal   Post-op Pain Management:    Induction: Intravenous  Airway Management Planned: Nasal Cannula  Additional Equipment:   Intra-op Plan:   Post-operative Plan:   Informed Consent: I have reviewed the patients History and Physical, chart, labs and discussed the procedure including the risks, benefits and alternatives for the proposed anesthesia with the patient or authorized representative who has indicated his/her understanding and acceptance.   Dental advisory given  Plan Discussed with: CRNA and Surgeon  Anesthesia Plan Comments:         Anesthesia Quick Evaluation

## 2015-11-30 LAB — CBC
HCT: 32 % — ABNORMAL LOW (ref 35.0–47.0)
Hemoglobin: 10.7 g/dL — ABNORMAL LOW (ref 12.0–16.0)
MCH: 31.5 pg (ref 26.0–34.0)
MCHC: 33.4 g/dL (ref 32.0–36.0)
MCV: 94.5 fL (ref 80.0–100.0)
PLATELETS: 175 10*3/uL (ref 150–440)
RBC: 3.39 MIL/uL — AB (ref 3.80–5.20)
RDW: 14.2 % (ref 11.5–14.5)
WBC: 6.7 10*3/uL (ref 3.6–11.0)

## 2015-11-30 LAB — BASIC METABOLIC PANEL
ANION GAP: 6 (ref 5–15)
BUN: 14 mg/dL (ref 6–20)
CALCIUM: 8.4 mg/dL — AB (ref 8.9–10.3)
CO2: 28 mmol/L (ref 22–32)
Chloride: 106 mmol/L (ref 101–111)
Creatinine, Ser: 0.97 mg/dL (ref 0.44–1.00)
GFR calc non Af Amer: >60 mL/min (ref >60)
Glucose, Bld: 130 mg/dL — ABNORMAL HIGH (ref 65–99)
POTASSIUM: 4.4 mmol/L (ref 3.5–5.1)
SODIUM: 140 mmol/L (ref 135–145)

## 2015-11-30 LAB — GLUCOSE, CAPILLARY: Glucose-Capillary: 126 mg/dL — ABNORMAL HIGH (ref 65–99)

## 2015-11-30 MED ORDER — HYDROCODONE-ACETAMINOPHEN 5-325 MG PO TABS
2.0000 | ORAL_TABLET | ORAL | Status: DC | PRN
  Administered 2015-11-30 – 2015-12-03 (×14): 2 via ORAL
  Filled 2015-11-30 (×14): qty 2

## 2015-11-30 MED ORDER — FLUTICASONE PROPIONATE 50 MCG/ACT NA SUSP
2.0000 | Freq: Every day | NASAL | Status: DC | PRN
  Filled 2015-11-30: qty 16

## 2015-11-30 NOTE — Progress Notes (Signed)
Physical Therapy Treatment Patient Details Name: Deborah Reynolds MRN: [id number] DOB: [birth year] Today's Date: 11/30/2015    History of Present Illness This patient is a 60 year old female who came to [facility] for a R TKR 11/29/15    PT Comments    Pt does better this afternoon but continues to remain limited secondary to lethargy and pain.  Her knee is very stiff and despite much warm up exercises and post ambulation is very stiff and pain limited with ROM.  Pt ambulated ~30 ft this afternoon but with great effort and 2 standing rest breaks.  Pt very tired with the session but did show good effort the entire time.   Follow Up Recommendations  SNF     Equipment Recommendations       Recommendations for Other Services       Precautions / Restrictions Precautions Precautions: Knee Restrictions RLE Weight Bearing: Partial weight bearing    Mobility  Bed Mobility Overal bed mobility: Needs Assistance Bed Mobility: Sit to Supine       Sit to supine: Min assist   General bed mobility comments: Pt shows great effort trying to get R LE up unto bed but is unable to do so w/o assist  Transfers Overall transfer level: Modified independent Equipment used: Rolling walker (2 wheeled) Transfers: Sit to/from Stand Sit to Stand: Min guard         General transfer comment: Pt shows ability to lift herself from recliner with much effort and cues for set up and sequencing, but she does not need actaul physical assist to rise with walker.    Ambulation/Gait Ambulation/Gait assistance: Min assist Ambulation Distance (Feet): 30 Feet Assistive device: Rolling walker (2 wheeled)       General Gait Details: Pt again very slow and cautious with ambulation.  She shows good effort trying to ambulate farther but becomes very fatigued and barely makes it back to the bed needing 2 standing rest breaks and a lot of encouragment.  Her O2 (low 90s) and HR (80s) remain relatively stable but she clearly  struggled with the effort.    Stairs            Wheelchair Mobility    Modified Rankin (Stroke Patients Only)       Balance                                    Cognition Arousal/Alertness: Lethargic Behavior During Therapy: WFL for tasks assessed/performed Overall Cognitive Status: Within Functional Limits for tasks assessed                      Exercises Total Joint Exercises Ankle Circles/Pumps: Strengthening;10 reps Quad Sets: Strengthening;10 reps Gluteal Sets: Strengthening;10 reps Short Arc Quad: AAROM;10 reps Heel Slides: AAROM;10 reps Hip ABduction/ADduction: Strengthening;AROM;10 reps Knee Flexion: PROM;5 reps Goniometric ROM: Pt unable to achieve 60 degrees this afternoon secondary to pain.     General Comments        Pertinent Vitals/Pain Pain Score: 6     Home Living                      Prior Function            PT Goals (current goals can now be found in the care plan section) Progress towards PT goals: Progressing toward goals    Frequency  BID  PT Plan Current plan remains appropriate    Co-evaluation             End of Session Equipment Utilized During Treatment: Gait belt Activity Tolerance: Patient limited by fatigue Patient left: in chair;with call bell/phone within reach;with chair alarm set;with SCD's reapplied     Time: 1610-9604 PT Time Calculation (min) (ACUTE ONLY): 31 min  Charges:  $Gait Training: 8-22 mins $Therapeutic Exercise: 8-22 mins                    G Codes:     Loran Senters, PT, DPT [phone]  Malachi Pro 11/30/2015, 3:55 PM

## 2015-11-30 NOTE — Progress Notes (Signed)
Physical Therapy Treatment Patient Details Name: Deborah Reynolds MRN: [id number] DOB: 1956-04-19 Today's Date: 11/30/2015    History of Present Illness This patient is a 60 year old female who came to [facility] for a R TKR 11/29/15    PT Comments    Pt is able to participate with PT but has pain and lethargy (likely related to pain meds) that make full participation difficult.  She is able to show good effort despite pain but ultimately is not very safe or steady with standing/ambulation acts.  She is reliant on the walker, has the KI donned but is unable to take very confident or efficient steps.  Per today's performance she will need short term rehab will continue to assess as her lethargy/mental status improves.   Follow Up Recommendations  SNF (per today's performance, may be able to go home progress)     Equipment Recommendations  Rolling walker with 5" wheels    Recommendations for Other Services       Precautions / Restrictions Precautions Precautions: Knee Precaution Booklet Issued: Yes (comment) (per PT) Restrictions Weight Bearing Restrictions: Yes RLE Weight Bearing: Partial weight bearing    Mobility  Bed Mobility Overal bed mobility: Needs Assistance Bed Mobility: Supine to Sit     Supine to sit: Min assist;Mod assist     General bed mobility comments: Pt shows good effort but is lethahargic and limited needing assist to get to upright   Transfers Overall transfer level: Needs assistance Equipment used: Rolling walker (2 wheeled) Transfers: Sit to/from Stand Sit to Stand: Min assist         General transfer comment: KI donned, pt able to rise with min assist, but she is very unsure of herself and needed much cuing and assist  Ambulation/Gait Ambulation/Gait assistance: Mod assist Ambulation Distance (Feet): 6 Feet Assistive device: Rolling walker (2 wheeled)       General Gait Details: Pt with very slow, guarded ambulation, KI donned, chair following.   Pt hardly able to keep eyes open and though she shows good effort was ultimately very limited and generally unsafe with ambulation.    Stairs            Wheelchair Mobility    Modified Rankin (Stroke Patients Only)       Balance                                    Cognition Arousal/Alertness: Lethargic Behavior During Therapy: WFL for tasks assessed/performed Overall Cognitive Status:  (pt lethragic/groggy, appears to be from meds)                      Exercises Total Joint Exercises Ankle Circles/Pumps: Strengthening;10 reps Quad Sets: Strengthening;10 reps Gluteal Sets: Strengthening;10 reps Short Arc Quad: AAROM;10 reps Heel Slides: AAROM;10 reps Hip ABduction/ADduction: Strengthening;AROM;10 reps Knee Flexion: PROM;5 reps Goniometric ROM: 4-66    General Comments        Pertinent Vitals/Pain Pain Score: 7     Home Living Family/patient expects to be discharged to:: Private residence Living Arrangements: Children Available Help at Discharge: Family Type of Home: Apartment Home Access: Stairs to enter Entrance Stairs-Rails: None Home Layout: One level Home Equipment: Cane - quad;Grab bars - tub/shower;Grab bars - toilet      Prior Function Level of Independence: Independent          PT Goals (current goals can now be  found in the care plan section) Progress towards PT goals: Progressing toward goals (slow progress)    Frequency  BID    PT Plan Current plan remains appropriate    Co-evaluation             End of Session Equipment Utilized During Treatment: Gait belt Activity Tolerance: Patient tolerated treatment well Patient left: in chair;with call bell/phone within reach;with chair alarm set;with SCD's reapplied     Time: 1610-9604 PT Time Calculation (min) (ACUTE ONLY): 27 min  Charges:  $Gait Training: 8-22 mins $Therapeutic Exercise: 8-22 mins                    G Codes:     Loran Senters, PT, DPT  [phone]  Malachi Pro 11/30/2015, 10:48 AM

## 2015-11-30 NOTE — Care Management (Signed)
MD has not returned my page. I will leave message at his office. MD please consider outpatient PT for this patient as home health arrangements are not prompt with [facility].

## 2015-11-30 NOTE — Clinical Social Work Placement (Signed)
   CLINICAL SOCIAL WORK PLACEMENT  NOTE  Date:  11/30/2015  Patient Details  Name: Deborah Reynolds MRN: [id number] Date of Birth: 1956-10-07  Clinical Social Work is seeking post-discharge placement for this patient at the Skilled  Nursing Facility level of care (*CSW will initial, date and re-position this form in  chart as items are completed):  Yes   Patient/family provided with [facility] Clinical Social Work Department's list of facilities offering this level of care within the geographic area requested by the patient (or if unable, by the patient's family).  Yes   Patient/family informed of their freedom to choose among providers that offer the needed level of care, that participate in Medicare, Medicaid or managed care program needed by the patient, have an available bed and are willing to accept the patient.  Yes   Patient/family informed of [facility] ownership interest in [facility] and [facility], as well as of the fact that they are under no obligation to receive care at these facilities.  PASRR submitted to EDS on 11/30/15     PASRR number received on 11/30/15     Existing PASRR number confirmed on       FL2 transmitted to all facilities in geographic area requested by pt/family on 11/30/15     FL2 transmitted to all facilities within larger geographic area on       Patient informed that his/her managed care company has contracts with or will negotiate with certain facilities, including the following:        Yes   Patient/family informed of bed offers received.  Patient chooses bed at  [facility] )     Physician recommends and patient chooses bed at      Patient to be transferred to   on  .  Patient to be transferred to facility by       Patient family notified on   of transfer.  Name of family member notified:        PHYSICIAN       Additional Comment:    _______________________________________________ Haig Prophet,  LCSW 11/30/2015, 2:21 PM

## 2015-11-30 NOTE — Progress Notes (Signed)
Per [facility] admissions coordinator at [facility] authorization has been received for 14 days. Per Rosey Bath if patient is ready for D/C over the weekend she will go to room 94. Clinical Social Worker (CSW) will fax D/C Summary to [phone]. CSW made MD aware that [employer] approved SNF and patient can D/C over the weekend if medically stable. Patient is aware of above. CSW will continue to follow and assist as needed.   Jetta Lout, LCSW [phone]

## 2015-11-30 NOTE — Care Management Note (Signed)
Case Management Note  Patient Details  Name: Deborah Reynolds MRN: [id number] Date of Birth: [birth year]  Subjective/Objective:                  Met with patient in presence of MD. She agrees to SNF which is currently recommended. MD would like for patient to return home. She can get 24/7 care and transportation but home health and DME will have to go through [facility] [phone] fax [phone]. Still not DME/Home health orders available for [facility] to fax to [facility]. Patient uses [facility] in [place] [phone] for Rx. She has ordered a bedside commode because "[employer]ella Scheuermann would not cover it". She requests a rolling walker. She has a new one in the room but she is not sure "where" it came from.  Action/Plan: I have requested home health orders and walker Rx from Dr. Marin Olp and will fax to [facility] when available. OP PT may be more ideal- MD aware and agrees.    Expected Discharge Date:                  Expected Discharge Plan:     In-House Referral:  Clinical Social Work  Discharge planning Services  CM Consult  Post Acute Care Choice:  Durable Medical Equipment, Home Health Choice offered to:  Patient  DME Arranged:    DME Agency:     HH Arranged:    [place] Agency:     Status of Service:  In process, will continue to follow  Medicare Important Message Given:    Date Medicare IM Given:    Medicare IM give by:    Date Additional Medicare IM Given:    Additional Medicare Important Message give by:     If discussed at [place] of Stay Meetings, dates discussed:    Additional Comments:  Marshell Garfinkel, RN 11/30/2015, 1:30 PM

## 2015-11-30 NOTE — NC FL2 (Signed)
[place] MEDICAID FL2 LEVEL OF CARE SCREENING TOOL     IDENTIFICATION  Patient Name: Deborah Reynolds Birthdate: 1956/09/19 Sex: female Admission Date (Current Location): 11/29/2015  [place] and [place] Number:  Chiropodist and Address:  [facility], [address] [place], [place] [address]      Provider Number: [id number]  Attending Physician Name and Address:  Juanell Fairly, MD  Relative Name and Phone Number:       Current Level of Care: Hospital Recommended Level of Care: Skilled Nursing Facility Prior Approval Number:    Date Approved/Denied:   PASRR Number:  [id number])  Discharge Plan: SNF    Current Diagnoses: Patient Active Problem List   Diagnosis Date Noted  . History of total right knee replacement 11/29/2015    Orientation RESPIRATION BLADDER Height & Weight    Self, Time, Situation, Place  Normal Continent  (167.6 cm) 223 lbs.  BEHAVIORAL SYMPTOMS/MOOD NEUROLOGICAL BOWEL NUTRITION STATUS   (none )  (none ) Continent Diet (Regular Diet )  AMBULATORY STATUS COMMUNICATION OF NEEDS Skin   Extensive Assist Verbally Surgical wounds (Incision: Right Knee )                       Personal Care Assistance Level of Assistance  Bathing, Feeding, Dressing Bathing Assistance: Limited assistance Feeding assistance: Independent Dressing Assistance: Limited assistance     Functional Limitations Info  Sight, Hearing, Speech Sight Info: Adequate Hearing Info: Adequate Speech Info: Adequate    SPECIAL CARE FACTORS FREQUENCY  PT (By licensed PT), OT (By licensed OT)     PT Frequency:  (5) OT Frequency:  (5)            Contractures      Additional Factors Info  Code Status Code Status Info:  (Full Code. )             Current Medications (11/30/2015):  This is the current hospital active medication list Current Facility-Administered Medications  Medication Dose Route Frequency Provider Last Rate  Last Dose  . acetaminophen (TYLENOL) tablet 650 mg  650 mg Oral Q6H PRN Juanell Fairly, MD       Or  . acetaminophen (TYLENOL) suppository 650 mg  650 mg Rectal Q6H PRN Juanell Fairly, MD      . alum & mag hydroxide-simeth (MAALOX/MYLANTA) 200-200-20 MG/5ML suspension 30 mL  30 mL Oral Q4H PRN Juanell Fairly, MD      . aspirin EC tablet 81 mg  81 mg Oral Daily Juanell Fairly, MD   81 mg at 11/30/15 5956  . bisacodyl (DULCOLAX) EC tablet 5 mg  5 mg Oral Daily PRN Juanell Fairly, MD      . cholecalciferol (VITAMIN D) tablet 1,000 Units  1,000 Units Oral Daily Juanell Fairly, MD   1,000 Units at 11/30/15 [phone]  . diphenhydrAMINE (BENADRYL) 12.5 MG/5ML elixir 12.5-25 mg  12.5-25 mg Oral Q4H PRN Juanell Fairly, MD      . docusate sodium (COLACE) capsule 100 mg  100 mg Oral BID Juanell Fairly, MD   100 mg at 11/30/15 6433  . enoxaparin (LOVENOX) injection 30 mg  30 mg Subcutaneous Q12H Juanell Fairly, MD   30 mg at 11/30/15 0840  . fluticasone (FLONASE) 50 MCG/ACT nasal spray 2 spray  2 spray Each Nare Daily PRN Juanell Fairly, MD      . gabapentin (NEURONTIN) tablet 600 mg  600 mg Oral TID Juanell Fairly, MD   600 mg  at 11/30/15 0837  . HYDROmorphone (DILAUDID) injection 1 mg  1 mg Intravenous Q2H PRN Juanell Fairly, MD   1 mg at 11/30/15 0110  . magnesium hydroxide (MILK OF MAGNESIA) suspension 30 mL  30 mL Oral Daily PRN Juanell Fairly, MD      . menthol-cetylpyridinium (CEPACOL) lozenge 3 mg  1 lozenge Oral PRN Juanell Fairly, MD       Or  . phenol (CHLORASEPTIC) mouth spray 1 spray  1 spray Mouth/Throat PRN Juanell Fairly, MD      . methocarbamol (ROBAXIN) tablet 500 mg  500 mg Oral Q6H PRN Juanell Fairly, MD   500 mg at 11/30/15 1056   Or  . methocarbamol (ROBAXIN) 500 mg in dextrose 5 % 50 mL IVPB  500 mg Intravenous Q6H PRN Juanell Fairly, MD      . metoCLOPramide (REGLAN) tablet 5-10 mg  5-10 mg Oral Q8H PRN Juanell Fairly, MD       Or  . metoCLOPramide (REGLAN) injection  5-10 mg  5-10 mg Intravenous Q8H PRN Juanell Fairly, MD      . mirabegron ER [facility]) tablet 50 mg  50 mg Oral PRN Juanell Fairly, MD      . mupirocin ointment (BACTROBAN) 2 %   Nasal BID Juanell Fairly, MD      . ondansetron [facility]) tablet 4 mg  4 mg Oral Q6H PRN Juanell Fairly, MD       Or  . ondansetron [facility]) injection 4 mg  4 mg Intravenous Q6H PRN Juanell Fairly, MD      . oxyCODONE (Oxy IR/ROXICODONE) immediate release tablet 10-15 mg  10-15 mg Oral Q3H PRN Juanell Fairly, MD   10 mg at 11/30/15 1218  . pantoprazole (PROTONIX) EC tablet 40 mg  40 mg Oral Daily Juanell Fairly, MD   40 mg at 11/30/15 0837  . sodium phosphate (FLEET) 7-19 GM/118ML enema 1 enema  1 enema Rectal Once PRN Juanell Fairly, MD      . SUMAtriptan [facility]) tablet 100 mg  100 mg Oral PRN Juanell Fairly, MD      . topiramate (TOPAMAX) tablet 25 mg  25 mg Oral BID Juanell Fairly, MD   25 mg at 11/30/15 3419  . vitamin B-12 (CYANOCOBALAMIN) tablet 1,000 mcg  1,000 mcg Oral Daily Juanell Fairly, MD   1,000 mcg at 11/30/15 3790     Discharge Medications: Please see discharge summary for a list of discharge medications.  Relevant Imaging Results:  Relevant Lab Results:   Additional Information  (SSN: [id number])  Haig Prophet, LCSW

## 2015-11-30 NOTE — Clinical Social Work Note (Signed)
Clinical Social Work Assessment  Patient Details  Name: Deborah Reynolds MRN: [id number] Date of Birth: [birth year]  Date of referral:  11/30/15               Reason for consult:  Facility Placement                Permission sought to share information with:  Chartered certified accountant granted to share information::  Yes, Verbal Permission Granted  Name::      [place]::   [place]   Relationship::     Contact Information:     Housing/Transportation Living arrangements for the past 2 months:  [place] of Information:  Patient Patient Interpreter Needed:  None Criminal Activity/Legal Involvement Pertinent to Current Situation/Hospitalization:  No - Comment as needed Significant Relationships:  Adult Children Lives with:  Adult Children Do you feel safe going back to the place where you live?  Yes Need for family participation in patient care:  Yes (Comment)  Care giving concerns: Patient lives in [place] with her 60 y.o son Deborah Reynolds.   Social Worker assessment / plan: [place] representative (CSW) received SNF consult. Patient is a [employer] bundle patient with [facility]. PT recommended home health and then changed recommendation to SNF. CSW met with patient to discuss D/C plan. Patient reported that she lives in downtown [place] with her son Deborah Reynolds. Per patient she is originally from [place]. CSW explained that PT is now recommending SNF. Patient is agreeable to SNF search in [place] and [place]. CSW explained that patient's insurance Christella Scheuermann will require authorization for SNF.   CSW met with patient for a second time with Dr. Mack Guise and RN Case Manager at bedside. Per MD he reported that patient will stay over the weekend and likely go home on Monday. CSW presented bed offers. Patient chose [facility]. [facility] admissions coordinator at [place] will start [place] authorization today. RN Case Manager is  making arrangements for home health as well. CSW update [place] Bundle Case Manager. CSW will continue to follow and assist as needed.    Employment status:  Retired, Disabled (Comment on whether or not currently receiving Disability) Insurance information:  Managed Care PT Recommendations:  [place], Home with [place] / Referral to community resources:  [place]  Patient/Family's Response to care:  Patient is agreeable to [facility] or home health.   Patient/Family's Understanding of and Emotional Response to Diagnosis, Current Treatment, and Prognosis: Patient was pleasant throughout assessment.   Emotional Assessment Appearance:  Appears stated age Attitude/Demeanor/Rapport:    Affect (typically observed):  Accepting, Adaptable, Pleasant Orientation:  Oriented to Self, Oriented to Place, Oriented to  Time, Oriented to Situation Alcohol / Substance use:  Not Applicable Psych involvement (Current and /or in the community):  No (Comment)  Discharge Needs  Concerns to be addressed:  Discharge Planning Concerns Readmission within the last 30 days:  No Current discharge risk:  Dependent with Mobility Barriers to Discharge:  Continued Medical Work up   Loralyn Freshwater, LCSW 11/30/2015, 2:22 PM

## 2015-11-30 NOTE — Anesthesia Postprocedure Evaluation (Signed)
  Anesthesia Post-op Note  Patient: Deborah Reynolds  Procedure(s) Performed: Procedure(s): TOTAL KNEE ARTHROPLASTY (Right)  Anesthesia type:Spinal  Patient location: 155  Post pain: Pain level controlled  Post assessment: Post-op Vital signs reviewed, Patient's Cardiovascular Status Stable, Respiratory Function Stable, Patent Airway and No signs of Nausea or vomiting  Post vital signs: Reviewed and stable  Last Vitals:  Filed Vitals:   11/29/15 2050 11/30/15 0410  BP: 121/68 97/59  Pulse: 73 87  Temp: 36.8 C 37.2 C  Resp: 16 14    Level of consciousness: awake, alert  and patient cooperative  Complications: No apparent anesthesia complications

## 2015-11-30 NOTE — Evaluation (Signed)
Occupational Therapy Evaluation Patient Details Name: Ivonna Kinnick MRN: [id number] DOB: Jul 07, 1956 Today's Date: 11/30/2015    History of Present Illness This patient is a 60 year old female who came to [facility] for a R TKR   Clinical Impression   This patient is a 60 year old female who came to [facility] for a R total knee replacement.  Patient lives in a one story home with her son. There are 4 steps to enter with out a rail.  She had been independent with ADL and functional mobility. She now shows deficits with pain, mobility and activities of daily living. She now requires assistance with activities of daily living.      Follow Up Recommendations  SNF    Equipment Recommendations       Recommendations for Other Services       Precautions / Restrictions Precautions Precautions: Knee Precaution Booklet Issued: Yes (comment) (per PT) Restrictions RLE Weight Bearing: Partial weight bearing      Mobility Bed Mobility                  Transfers                      Balance                                            ADL                                         General ADL Comments: Patient had been independent with BADL. She now requires assist. Patient a little sleepy during the evaluation but willing to participate. Practiced techniques for Donned/doffed socks and pants to knees (drain still in place). She required hand over hand assist to demonstrate techniques using hip kit. Physical and verbal cues needed.     Vision     Perception     Praxis      Pertinent Vitals/Pain Pain Score: 5      Hand Dominance Right   Extremity/Trunk Assessment Upper Extremity Assessment Upper Extremity Assessment:  (Patient with 5/5 B UE strength)           Communication Communication Communication: No difficulties   Cognition Arousal/Alertness: Awake/alert Behavior During Therapy: WFL for tasks  assessed/performed Overall Cognitive Status: Within Functional Limits for tasks assessed                     General Comments       Exercises       Shoulder Instructions      Home Living Family/patient expects to be discharged to:: Private residence Living Arrangements: Children Available Help at Discharge: Family Type of Home: Apartment Home Access: Stairs to enter Technical brewer of Steps: 4 Entrance Stairs-Rails: None Home Layout: One level     Bathroom Shower/Tub: Teacher, early years/pre: Standard     Home Equipment: Cane - quad;Grab bars - tub/shower;Grab bars - toilet          Prior Functioning/Environment Level of Independence: Independent             OT Diagnosis: Acute pain   OT Problem List: Decreased range of motion;Decreased activity tolerance;Impaired balance (sitting and/or standing);Decreased knowledge of use of  DME or AE;Pain   OT Treatment/Interventions: Self-care/ADL training    OT Goals(Current goals can be found in the care plan section) Acute Rehab OT Goals Patient Stated Goal: improve OT Goal Formulation: With patient Time For Goal Achievement: 12/14/15 Potential to Achieve Goals: Good  OT Frequency: Min 1X/week   Barriers to D/C:            Co-evaluation              End of Session Equipment Utilized During Treatment:  (hip kit)  Activity Tolerance: Patient limited by lethargy Patient left: in chair;with call bell/phone within reach;with chair alarm set   Time: 3612-2449 OT Time Calculation (min): 20 min Charges:  OT General Charges $OT Visit: 1 Procedure OT Evaluation $OT Eval Low Complexity: 1 Procedure OT Treatments $Self Care/Home Management : 8-22 mins G-Codes:    Myrene Galas, MS/OTR/L   11/30/2015, 10:10 AM

## 2015-11-30 NOTE — Progress Notes (Signed)
Subjective:  POD #1 s/p right total knee.   Patient reports pain as mild.  Patient feels drowsy likely secondary to her narcotic medication. She is up out of bed to a chair. She has worked with physical therapy this morning. Her Hemovac is clotted. Patient is using incentive spirometry. She denies any shortness of breath chest pain or abdominal pain. Patient denies nausea and vomiting  Objective:   VITALS:   Filed Vitals:   11/29/15 1707 11/29/15 2050 11/30/15 0410 11/30/15 0844  BP: 109/69 121/68 97/59 117/71  Pulse: 72 73 87 87  Temp: 97.9 F (36.6 C) 98.2 F (36.8 C) 99 F (37.2 C) 99.3 F (37.4 C)  TempSrc: Oral Oral Oral Oral  Resp: Height:      Weight:      SpO2: 100% 100% 94% 96%    PHYSICAL EXAM:  Right lower extremity: Patient's Hemovac drain was removed by me personally. Dressing is clean dry and intact. She has a Polar Care overlying the right knee. Knee immobilizer is in place but was loosened to examine the dressing. Patient has TED stockings and foot pumps. She has palpable pedal pulses, intact sensation to light touch and can flex and extend her toes and dorsiflex and plantarflex her ankle. Her leg compartments remain soft and compressible.   LABS  Results for orders placed or performed during the hospital encounter of 11/29/15 (from the past 24 hour(s))  CBC     Status: Abnormal   Collection Time: 11/30/15  6:01 AM  Result Value Ref Range   WBC 6.7 3.6 - 11.0 K/uL   RBC 3.39 (L) 3.80 - 5.20 MIL/uL   Hemoglobin 10.7 (L) 12.0 - 16.0 g/dL   HCT 24.4 (L) 01.0 - 27.2 %   MCV 94.5 80.0 - 100.0 fL   MCH 31.5 26.0 - 34.0 pg   MCHC 33.4 32.0 - 36.0 g/dL   RDW 53.6 64.4 - 03.4 %   Platelets 175 150 - 440 K/uL  Basic metabolic panel     Status: Abnormal   Collection Time: 11/30/15  6:01 AM  Result Value Ref Range   Sodium 140 135 - 145 mmol/L   Potassium 4.4 3.5 - 5.1 mmol/L   Chloride 106 101 - 111 mmol/L   CO2 28 22 - 32 mmol/L   Glucose, Bld  130 (H) 65 - 99 mg/dL   BUN 14 6 - 20 mg/dL   Creatinine, Ser 7.42 0.44 - 1.00 mg/dL   Calcium 8.4 (L) 8.9 - 10.3 mg/dL   GFR calc non Af Amer >60 >60 mL/min   GFR calc Af Amer >60 >60 mL/min   Anion gap 6 5 - 15  Glucose, capillary     Status: Abnormal   Collection Time: 11/30/15  8:56 AM  Result Value Ref Range   Glucose-Capillary 126 (H) 65 - 99 mg/dL    Dg Knee 1-2 Views Right  11/29/2015  CLINICAL DATA:  Status post total knee replacement EXAM: RIGHT KNEE - 1-2 VIEW COMPARISON:  None. FINDINGS: Frontal and lateral views were obtained. The patient is status post total knee replacement right with femoral and tibial prosthetic components appearing well-seated. No fracture or dislocation. Drains are present within the knee joint. Soft tissue air is an expected postoperative finding. IMPRESSION: Prosthetic components appear well seated. No acute fracture or dislocation. Electronically Signed   By: Bretta Bang III M.D.   On: 11/29/2015 11:54    Assessment/Plan: 1 Day Post-Op  Active Problems:   History of total right knee replacement  Patient stable postop. I'm adjusting her narcotic pain medications to hydrocodone. Patient continue with physical and occupational therapy. She was encouraged to continue use her incentive spirometry while awake. She will continue to elevate the right lower extremity. Patient will also continue using CPM daily. She is partial weightbearing on the right lower extremity and will use a walker for assistance with ambulation. Patient is currently on Lovenox for DVT prophylaxis. Foley catheter has been removed. She is completed 24 hours of postop antibiotics.   Juanell Fairly , MD 11/30/2015, 1:32 PM

## 2015-11-30 NOTE — Care Management (Signed)
Dr. Dario Ave paged for home health orders which will be reviewed/arranged by [employer] including DME. RNCM will follow up with patient.

## 2015-12-01 LAB — CBC
HCT: 28.7 % — ABNORMAL LOW (ref 35.0–47.0)
Hemoglobin: 9.8 g/dL — ABNORMAL LOW (ref 12.0–16.0)
MCH: 32 pg (ref 26.0–34.0)
MCHC: 34.1 g/dL (ref 32.0–36.0)
MCV: 93.7 fL (ref 80.0–100.0)
Platelets: 144 10*3/uL — ABNORMAL LOW (ref 150–440)
RBC: 3.06 MIL/uL — ABNORMAL LOW (ref 3.80–5.20)
RDW: 14.2 % (ref 11.5–14.5)
WBC: 7.3 10*3/uL (ref 3.6–11.0)

## 2015-12-01 NOTE — Progress Notes (Signed)
Patient ID: Deborah Reynolds, female   DOB: 02/28/1956, 60 y.o.   MRN: [id number] Patient is awake, alert and comfortable. No nausea. Tolerating PT well. Neurovascular exam is intact. Afebrile Hgb 9.8 Continue with present plan as ordered.

## 2015-12-01 NOTE — Progress Notes (Signed)
Occupational Therapy Treatment Patient Details Name: Deborah Reynolds MRN: [id number] DOB: [birth year] Today's Date: 12/01/2015    History of present illness This patient is a 60 year old female who came to [facility] for a R TKR 11/29/15   OT comments  Patient just finishing with PT when OT arrived and was sitting up in recliner. Educated on use of AE and used to donn/doff socks with occasional verbal cues to start. Patient asking questions about eventual discharge home and compensatory techniques. Educated on safety using rolling walker for ADL tasks and safety with proper hand placement for functional transfers. Patient verbalized understanding of all education.   Follow Up Recommendations  SNF    Equipment Recommendations       Recommendations for Other Services      Precautions / Restrictions Precautions Precautions: Knee Restrictions Weight Bearing Restrictions: Yes RLE Weight Bearing: Partial weight bearing       Mobility Bed Mobility                  Transfers                      Balance                                   ADL Overall ADL's : Needs assistance/impaired                     Lower Body Dressing: Min guard;With adaptive equipment;Cueing for compensatory techniques                        Vision                     Perception     Praxis      Cognition   Behavior During Therapy: [facility] for tasks assessed/performed Overall Cognitive Status: Within Functional Limits for tasks assessed                       Extremity/Trunk Assessment               Exercises     Shoulder Instructions       General Comments      Pertinent Vitals/ Pain       Pain Assessment: 0-10 Pain Score: 6  Pain Location: Right Knee Pain Intervention(s): Limited activity within patient's tolerance;Monitored during session  Home Living                                          Prior  Functioning/Environment              Frequency Min 1X/week     Progress Toward Goals  OT Goals(current goals can now be found in the care plan section)  Progress towards OT goals: Progressing toward goals  Acute Rehab OT Goals Patient Stated Goal: Improve functioning with less pain OT Goal Formulation: With patient Time For Goal Achievement: 12/14/15 Potential to Achieve Goals: Good  Plan Discharge plan remains appropriate    Co-evaluation                 End of Session     Activity Tolerance Patient limited by pain   Patient Left in chair;with call bell/phone within reach;with  chair alarm set   Nurse Communication          Time: [phone] OT Time Calculation (min): 23 min  Charges: OT General Charges $OT Visit: 1 Procedure OT Treatments $Self Care/Home Management : 23-37 mins  Bailey Kolbe L 12/01/2015, 12:59 PM  Kirstie Peri, OTR/L

## 2015-12-01 NOTE — Progress Notes (Signed)
Physical Therapy Treatment Patient Details Name: Deborah Reynolds MRN: [id number] DOB: [birth year] Today's Date: 12/01/2015    History of Present Illness This patient is a 60 year old female who came to [facility] for a R TKR 11/29/15    PT Comments    Pt was able to tolerate some gait and exercise with pt being handed off to OT when PT finished.  Pt agreed to stay OOB and to continue to build some tolerance for movement right after PT finished.  Good effort and will be able to progress well in SNF with her attitude.  Follow Up Recommendations  SNF     Equipment Recommendations       Recommendations for Other Services       Precautions / Restrictions Precautions Precautions: Knee;Fall Required Braces or Orthoses: Knee Immobilizer - Right Knee Immobilizer - Right: On when out of bed or walking Restrictions Weight Bearing Restrictions: Yes RLE Weight Bearing: Partial weight bearing    Mobility  Bed Mobility               General bed mobility comments: up when PT entered  Transfers Overall transfer level: Needs assistance Equipment used: Rolling walker (2 wheeled) Transfers: Sit to/from [employer] Sit to Stand: Min guard;Min assist Stand pivot transfers: Min assist       General transfer comment: KI on with standing  Ambulation/Gait Ambulation/Gait assistance: Min guard;Min assist Ambulation Distance (Feet): 140 Feet Assistive device: Rolling walker (2 wheeled) Gait Pattern/deviations: Step-through pattern;Trunk flexed;Wide base of support Gait velocity: Decreased Gait velocity interpretation: Below normal speed for age/gender General Gait Details: Pt used good judgment to determine distance and control safety to walk   Stairs            Wheelchair Mobility    Modified Rankin (Stroke Patients Only)       Balance Overall balance assessment: Needs assistance Sitting-balance support: Feet supported Sitting balance-Leahy Scale: Normal    Postural control: Posterior lean Standing balance support: Bilateral upper extremity supported Standing balance-Leahy Scale: Fair                      Cognition Arousal/Alertness: Awake/alert Behavior During Therapy: WFL for tasks assessed/performed Overall Cognitive Status: Within Functional Limits for tasks assessed                      Exercises Total Joint Exercises Ankle Circles/Pumps: AROM;Both;5 reps Quad Sets: AROM;Both;5 reps Short Arc Quad: AAROM;Right;5 reps Heel Slides: AROM;AAROM;Right;5 reps Hip ABduction/ADduction: AAROM Straight Leg Raises: AAROM;Right;10 reps Goniometric ROM: 64 deg flexion     General Comments General comments (skin integrity, edema, etc.): Has been up to walk with RW and PT to assist her moblity with stiff R knee flexion when measured      Pertinent Vitals/Pain Pain Assessment: 0-10 Pain Score: 5  Pain Location: R knee Pain Descriptors / Indicators: Aching Pain Intervention(s): Monitored during session;Repositioned;Premedicated before session    Home Living                      Prior Function            PT Goals (current goals can now be found in the care plan section) Acute Rehab PT Goals Patient Stated Goal: reduced pain Progress towards PT goals: Progressing toward goals    Frequency  7X/week    PT Plan Current plan remains appropriate    Co-evaluation  End of Session Equipment Utilized During Treatment: Gait belt Activity Tolerance: Patient limited by fatigue Patient left: in chair;with call bell/phone within reach;with chair alarm set;with SCD's reapplied     Time: 0388-8280 PT Time Calculation (min) (ACUTE ONLY): 24 min  Charges:  $Gait Training: 8-22 mins $Therapeutic Exercise: 8-22 mins                    G Codes:      Ivar Drape 2015/12/16, 3:41 PM   Samul Dada, PT MS Acute Rehab Dept. Number: [facility] [id number] and [facility] [phone]

## 2015-12-01 NOTE — Progress Notes (Signed)
Physical Therapy Treatment Patient Details Name: Deborah Reynolds MRN: [id number] DOB: 07-09-1956 Today's Date: 12/01/2015    History of Present Illness This patient is a 60 year old female who came to [facility] for a R TKR 11/29/15    PT Comments    Pt is back in therapy in afternoon but had more abbreviated effort due to pt complaint of being too tired.  Instructed CPM to nursing staff to allow them to continue with pt.   Her plan is still the same due to her limited walking and reduced ROM to R knee.  Follow Up Recommendations  SNF     Equipment Recommendations       Recommendations for Other Services       Precautions / Restrictions Precautions Precautions: Knee;Fall Required Braces or Orthoses: Knee Immobilizer - Right (Did not use for quick transfer to BR) Knee Immobilizer - Right: On when out of bed or walking Restrictions Weight Bearing Restrictions: Yes RLE Weight Bearing: Partial weight bearing    Mobility  Bed Mobility Overal bed mobility: Needs Assistance Bed Mobility: Supine to Sit;Sit to Supine     Supine to sit: Min assist Sit to supine: Min assist   General bed mobility comments: mainly help with RLE  Transfers Overall transfer level: Needs assistance Equipment used: Rolling walker (2 wheeled) Transfers: Sit to/from [employer] Sit to Stand: Min guard;Min assist Stand pivot transfers: Min assist       General transfer comment: KI left off to transfer to BR  Ambulation/Gait Ambulation/Gait assistance: Min guard Ambulation Distance (Feet): 30 Feet Assistive device: Rolling walker (2 wheeled) Gait Pattern/deviations: Step-through pattern Gait velocity: Decreased Gait velocity interpretation: Below normal speed for age/gender General Gait Details: Pt used good judgment to determine distance and control safety to walk   Stairs            Wheelchair Mobility    Modified Rankin (Stroke Patients Only)       Balance Overall  balance assessment: Needs assistance Sitting-balance support: Feet supported Sitting balance-Leahy Scale: Normal   Postural control: Posterior lean Standing balance support: Bilateral upper extremity supported Standing balance-Leahy Scale: Fair                      Cognition Arousal/Alertness: Awake/alert Behavior During Therapy: WFL for tasks assessed/performed Overall Cognitive Status: Within Functional Limits for tasks assessed                      Exercises Total Joint Exercises Ankle Circles/Pumps: AROM;Both;5 reps Quad Sets: AROM;Both;5 reps Short Arc Quad: AAROM;Right;5 reps Heel Slides: AROM;AAROM;Right;5 reps Hip ABduction/ADduction: AAROM Straight Leg Raises: AAROM;Right;10 reps Goniometric ROM: 75 flexion    General Comments General comments (skin integrity, edema, etc.): Has been up to walk with RW and PT to assist her moblity with stiff R knee flexion when measured      Pertinent Vitals/Pain Pain Assessment: Faces Pain Score: 5  Faces Pain Scale: Hurts little more Pain Location: R knee Pain Descriptors / Indicators: Aching Pain Intervention(s): Monitored during session;Premedicated before session;Repositioned    Home Living                      Prior Function            PT Goals (current goals can now be found in the care plan section) Acute Rehab PT Goals Patient Stated Goal: reduced pain Progress towards PT goals: Progressing toward goals  Frequency  7X/week    PT Plan Current plan remains appropriate    Co-evaluation             End of Session Equipment Utilized During Treatment: Gait belt Activity Tolerance: Patient limited by fatigue Patient left: with call bell/phone within reach;in bed;with bed alarm set;with nursing/sitter in room     Time: 1610-9604 PT Time Calculation (min) (ACUTE ONLY): 29 min  Charges:  $Gait Training: 8-22 mins $Therapeutic Exercise: 8-22 mins                    G Codes:       Ivar Drape 12/23/15, 3:48 PM   Samul Dada, PT MS Acute Rehab Dept. Number: [facility] [id number] and [facility] [phone]

## 2015-12-02 LAB — CBC
HCT: 27.5 % — ABNORMAL LOW (ref 35.0–47.0)
Hemoglobin: 9.3 g/dL — ABNORMAL LOW (ref 12.0–16.0)
MCH: 31.6 pg (ref 26.0–34.0)
MCHC: 33.8 g/dL (ref 32.0–36.0)
MCV: 93.7 fL (ref 80.0–100.0)
PLATELETS: 143 10*3/uL — AB (ref 150–440)
RBC: 2.93 MIL/uL — AB (ref 3.80–5.20)
RDW: 14.1 % (ref 11.5–14.5)
WBC: 7.4 10*3/uL (ref 3.6–11.0)

## 2015-12-02 NOTE — Progress Notes (Signed)
Patient tolerated Pt well this shift, complained of pain increasing towards end of shift.  Educated on proper positioning for knee.  Readjusted TENS placement.  PO pain medication and muscle relaxants used to help alievate pain.

## 2015-12-02 NOTE — Progress Notes (Signed)
Physical Therapy Treatment Patient Details Name: Pinki Rottman MRN: [id number] DOB: [birth year] Today's Date: 12/02/2015    History of Present Illness This patient is a 60 year old female who came to [facility] for a R TKR 11/29/15    PT Comments    Pt is so painful today that she is reducing her WBing more than previously on RLE, still in need of immobilizer but may be ready to DC tomorrow.  Her plan is to go to SNF which is very appropriate still given her level of pain and need for help with all movement.  Follow Up Recommendations  SNF     Equipment Recommendations  Rolling walker with 5" wheels    Recommendations for Other Services Rehab consult     Precautions / Restrictions Precautions Precautions: Knee;Fall Required Braces or Orthoses: Knee Immobilizer - Right Knee Immobilizer - Right: Other (comment);Discontinue once straight leg raise with < 10 degree lag (on when standing/walking) Restrictions Weight Bearing Restrictions: Yes RLE Weight Bearing: Partial weight bearing    Mobility  Bed Mobility Overal bed mobility: Needs Assistance Bed Mobility: Sit to Supine       Sit to supine: Min assist   General bed mobility comments: mainly help with RLE  Transfers Overall transfer level: Needs assistance Equipment used: Rolling walker (2 wheeled) Transfers: Sit to/from [employer] Sit to Stand: Min guard Stand pivot transfers: Min guard       General transfer comment: KI off to pivot to bed  Ambulation/Gait Ambulation/Gait assistance: Min guard Ambulation Distance (Feet): 150 Feet Assistive device: Rolling walker (2 wheeled) Gait Pattern/deviations: Step-through pattern;Decreased weight shift to right;Decreased dorsiflexion - right;Wide base of support;Trunk flexed Gait velocity: Decreased Gait velocity interpretation: Below normal speed for age/gender General Gait Details: Pt is determining walk distance with good decisions   Stairs            Wheelchair Mobility    Modified Rankin (Stroke Patients Only)       Balance     Sitting balance-Leahy Scale: Normal       Standing balance-Leahy Scale: Fair                      Cognition Arousal/Alertness: Awake/alert Behavior During Therapy: WFL for tasks assessed/performed Overall Cognitive Status: Within Functional Limits for tasks assessed                      Exercises Total Joint Exercises Ankle Circles/Pumps: AROM;Both;5 reps Heel Slides: AAROM;AROM;Both;10 reps Hip ABduction/ADduction: AAROM;Right;10 reps Straight Leg Raises: AAROM;Right;10 reps Long Arc Quad: AAROM;Right;10 reps Knee Flexion: AAROM;AROM;Right;10 reps Goniometric ROM: 74 deg flexion     General Comments        Pertinent Vitals/Pain Pain Assessment: 0-10 Pain Score: 7  Pain Location: R knee Pain Descriptors / Indicators: Aching Pain Intervention(s): Monitored during session;Premedicated before session;Repositioned;Ice applied;Patient requesting pain meds-RN notified    Home Living                      Prior Function            PT Goals (current goals can now be found in the care plan section) Acute Rehab PT Goals Patient Stated Goal: reduced pain Progress towards PT goals: Progressing toward goals    Frequency  7X/week    PT Plan Current plan remains appropriate    Co-evaluation             End of Session  Equipment Utilized During Treatment: Gait belt Activity Tolerance: Patient limited by fatigue;Patient limited by pain Patient left: in bed;with call bell/phone within reach     Time: 1025-1104 PT Time Calculation (min) (ACUTE ONLY): 39 min  Charges:  $Gait Training: 8-22 mins $Therapeutic Exercise: 8-22 mins $Therapeutic Activity: 8-22 mins                    G Codes:      Ivar Drape 2015-12-17, 1:58 PM   Samul Dada, PT MS Acute Rehab Dept. Number: [facility] [id number] and [facility] [phone]

## 2015-12-02 NOTE — Progress Notes (Signed)
  Subjective:  POD #3 s/p right TKA.  Patient reports right knee pain as moderate.  She states that she was able to walk to the nurse's station today with therapy.  Objective:   VITALS:   Filed Vitals:   12/01/15 1516 12/01/15 1946 12/02/15 0422 12/02/15 0820  BP: 124/70 102/60 116/68 109/62  Pulse: 82 86 83 91  Temp: 98.9 F (37.2 C) 99 F (37.2 C) 98.8 F (37.1 C) 99 F (37.2 C)  TempSrc: Oral Oral Oral Oral  Resp: 16 18 18 18   Height:      Weight:      SpO2: 100% 97% 96% 95%    PHYSICAL EXAM:  Right knee:  I personally changed patient's dressing today. Her incision is clean dry and intact. There is no erythema or drainage. There is no significant swelling. The leg and thigh compartments are soft and compressible. There is no calf tenderness or lower leg edema. She has palpable pedal pulses, intact sensation light touch intact motor function in the right lower extremity.   LABS  Results for orders placed or performed during the hospital encounter of 11/29/15 (from the past 24 hour(s))  CBC     Status: Abnormal   Collection Time: 12/02/15  3:39 AM  Result Value Ref Range   WBC 7.4 3.6 - 11.0 K/uL   RBC 2.93 (L) 3.80 - 5.20 MIL/uL   Hemoglobin 9.3 (L) 12.0 - 16.0 g/dL   HCT 57.3 (L) 22.0 - 25.4 %   MCV 93.7 80.0 - 100.0 fL   MCH 31.6 26.0 - 34.0 pg   MCHC 33.8 32.0 - 36.0 g/dL   RDW 27.0 62.3 - 76.2 %   Platelets 143 (L) 150 - 440 K/uL    No results found.  Assessment/Plan: 3 Days Post-Op   Active Problems:   History of total right knee replacement  Patient continues to make progress postoperatively. She will stay overnight for continued pain management. Plan will be for patient discharge tomorrow to home. Continue Lovenox for DVT prophylaxis. Encourage incentive spirometry while awake. Continue use of Polar Care and CPM.    Juanell Fairly , MD 12/02/2015, 1:22 PM

## 2015-12-03 MED ORDER — PNEUMOCOCCAL VAC POLYVALENT 25 MCG/0.5ML IJ INJ
0.5000 mL | INJECTION | INTRAMUSCULAR | Status: AC
  Administered 2015-12-03: 0.5 mL via INTRAMUSCULAR

## 2015-12-03 MED ORDER — ASPIRIN 325 MG PO TBEC: 90.0000 mg | DELAYED_RELEASE_TABLET | Freq: Two times a day (BID) | ORAL | Status: AC

## 2015-12-03 MED ORDER — ASPIRIN EC 325 MG PO TBEC: 325.0000 mg | DELAYED_RELEASE_TABLET | Freq: Two times a day (BID) | ORAL | Status: DC

## 2015-12-03 MED ORDER — OXYCODONE HCL 5 MG PO TABS: 5.0000 mg | ORAL_TABLET | ORAL | Status: AC | PRN

## 2015-12-03 NOTE — Progress Notes (Signed)
  Subjective:  POD #4 times post right total knee arthroplasty. Patient reports right knee pain pain as moderate.  Her pain was better controlled on oxycodone. Norco has not been as effective at controlling her pain. Patient states she did fine with physical therapy today.  Objective:   VITALS:   Filed Vitals:   12/02/15 1440 12/02/15 1957 12/03/15 0439 12/03/15 0932  BP: 124/79 135/75 103/70 113/63  Pulse: 86 81 82 99  Temp: 98.7 F (37.1 C) 98.3 F (36.8 C) 98.7 F (37.1 C)   TempSrc: Oral Oral Oral   Resp: Height:      Weight:      SpO2: 99% 99% 100% 97%    PHYSICAL EXAM:  Right knee: Patient's dressing was changed yesterday. The dressing remains clean dry and intact. Incision is visualized with a honeycomb dressing and demonstrates no drainage or erythema. Her leg and thigh compartments are soft and compressible. She has intact sensation to light touch throughout the right lower extremity. There is no calf tenderness or lower leg edema. She has palpable pedal pulses, intact sensation to light touch and intact motor function in the right lower leg with 5 out of 5 strength of all lower leg muscle groups.   LABS  No results found for this or any previous visit (from the past 24 hour(s)).  No results found.  Assessment/Plan: 4 Days Post-Op   Active Problems:   History of total right knee replacement  Patient has made appropriate progress postoperatively. She is doing well physical therapy. She will be discharged home today and continue to partial weight-bear on the right lower extremity for a total of 4 weeks postop. She will be changed to enteric-coated aspirin 325 mg by mouth twice a day for DVT prophylaxis. She'll continue wearing her TED stockings until follow-up. Patient will begin outpatient physical therapy in our office later this week.    Juanell Fairly , MD 12/03/2015, 1:08 PM

## 2015-12-03 NOTE — Discharge Summary (Signed)
Physician Discharge Summary  Patient ID: Deborah Reynolds MRN: [id number] DOB/AGE: 60-May-1957 60 y.o.  Admit date: 11/29/2015 Discharge date: 12/03/2015  Admission Diagnoses:  BILATERAL PRIMARY OSTEOARTHRITIS  Discharge Diagnoses:  BILATERAL PRIMARY OSTEOARTHRITIS Active Problems:   History of total right knee replacement   Past Medical History  Diagnosis Date  . Sleep apnea   . GERD (gastroesophageal reflux disease)   . Headache   . Arthritis   . Irritable bowel syndrome (IBS)   . Restless leg syndrome   . Gastric ulcer     Surgeries: Procedure(s): RIGHT TOTAL KNEE ARTHROPLASTY on 11/29/2015    Discharged Condition: Improved  Hospital Course: Deborah Reynolds is an 60 y.o. female who was admitted 11/29/2015 with a diagnosis of  BILATERAL PRIMARY OSTEOARTHRITIS  and went to the operating room on 11/29/2015 and underwent a successful, uncomplicated right total knee arthroplasty.    She was given perioperative antibiotics:  Anti-infectives    Start     Dose/Rate Route Frequency Ordered Stop   11/29/15 1330  ceFAZolin (ANCEF) IVPB 2 g/50 mL premix     2 g 100 mL/hr over 30 Minutes Intravenous Every 6 hours 11/29/15 1229 11/29/15 2137   11/29/15 0559  clindamycin (CLEOCIN) 600 MG/50ML IVPB    Comments:  HENRY, LOIS: cabinet override      11/29/15 0559 11/29/15 0821   11/29/15 0559  ceFAZolin (ANCEF) 2-3 GM-% IVPB SOLR    Comments:  HENRY, LOIS: cabinet override      11/29/15 0559 11/29/15 1759   11/29/15 0200  ceFAZolin (ANCEF) IVPB 2 g/50 mL premix     2 g 100 mL/hr over 30 Minutes Intravenous  Once 11/29/15 0158 11/29/15 0840   11/29/15 0200  clindamycin (CLEOCIN) IVPB 600 mg  Status:  Discontinued     600 mg 100 mL/hr over 30 Minutes Intravenous  Once 11/29/15 0158 11/29/15 1217    .  She was given sequential compression devices, early ambulation, and subcutaneous Lovenox for DVT prophylaxis. The night of surgery the patient started on CPM. This continued throughout her  hospitalization. She completed 24 hours of postop antibiotics. She was instructed to perform incentive spirometry 10 times each hour while awake.  On postop day #1 the patient began physical therapy. Her Foley catheter was removed. Labs were drawn including a CBC and BMP which showed no significant abnormalities. Patient had adjustments made to her pain medication and she was deemed to be drowsy which affected her ability to participate fully with therapy.  On postoperative day #2 patient continued to make progress physical therapy. She was still complaining of right knee pain.  On postop day #3 patient continued to have right knee pain. Her dressing was changed. Incision was clean dry and intact. She continued to work with physical therapy and was able to walk out to the nurse's station.  On postoperative day #4 the patient was doing well physical therapy. Given her clinical improvement she is prepared for discharge home. Patient was placed back on oxycodone from Norco for pain control.  She benefited maximally from the hospital stay and there were no complications.    Recent vital signs:  Filed Vitals:   12/03/15 0439 12/03/15 0932  BP: 103/70 113/63  Pulse: 82 99  Temp: 98.7 F (37.1 C)   Resp: 18 18    Recent laboratory studies:  Lab Results  Component Value Date   HGB 9.3* 12/02/2015   HGB 9.8* 12/01/2015   HGB 10.7* 11/30/2015   Lab Results  Component  Value Date   WBC 7.4 12/02/2015   PLT 143* 12/02/2015   Lab Results  Component Value Date   INR 1.15 11/21/2015   Lab Results  Component Value Date   NA 140 11/30/2015   K 4.4 11/30/2015   CL 106 11/30/2015   CO2 28 11/30/2015   BUN 14 11/30/2015   CREATININE 0.97 11/30/2015   GLUCOSE 130* 11/30/2015    Discharge Medications:     Medication List    STOP taking these medications        diclofenac sodium 1 % Gel  Commonly known as:  VOLTAREN     meloxicam 15 MG tablet  Commonly known as:  MOBIC      traMADol 50 MG tablet  Commonly known as:  ULTRAM      TAKE these medications        acetaminophen 500 MG tablet  Commonly known as:  TYLENOL  Take 1,000 mg by mouth every 6 (six) hours as needed.     aspirin 325 MG EC tablet  Take 1 tablet (325 mg total) by mouth 2 (two) times daily.     Biotin 5000 MCG Subl  Place 5,000 mcg under the tongue daily.     cholecalciferol 1000 units tablet  Commonly known as:  VITAMIN D  Take 1,000 Units by mouth daily.     esomeprazole 40 MG capsule  Commonly known as:  NEXIUM  Take 40 mg by mouth as needed.     fluticasone 50 MCG/ACT nasal spray  Commonly known as:  FLONASE  Place 2 sprays into both nostrils daily.     gabapentin 600 MG tablet  Commonly known as:  NEURONTIN  Take 600 mg by mouth 3 (three) times daily.     mirabegron ER 50 MG Tb24 tablet  Commonly known as:  MYRBETRIQ  Take 50 mg by mouth as needed.     oxyCODONE 5 MG immediate release tablet  Commonly known as:  Oxy IR/ROXICODONE  Take 1-2 tablets (5-10 mg total) by mouth every 4 (four) hours as needed for severe pain.     SUMAtriptan 100 MG tablet  Commonly known as:  IMITREX  Take 100 mg by mouth as needed for migraine. May repeat in 2 hours if headache persists or recurs.     topiramate 25 MG tablet  Commonly known as:  TOPAMAX  Take 25 mg by mouth 2 (two) times daily.     Vitamin B-12 5000 MCG Subl  Place 5,000 mcg under the tongue daily.        Diagnostic Studies: Dg Chest 2 View  11/21/2015  CLINICAL DATA:  Preoperative chest x-ray prior to right total knee replacement; history of sleep apnea, gastric bypass, nonsmoker. EXAM: CHEST  2 VIEW COMPARISON:  None in PACs FINDINGS: The lungs are adequately inflated and clear. The heart and pulmonary vascularity are normal. The mediastinum is normal in width. There is no pleural effusion. The bony thorax exhibits no acute abnormality. IMPRESSION: There is no active cardiopulmonary disease. Electronically Signed    By: David  [place] M.D.   On: 11/21/2015 14:11   Dg Knee 1-2 Views Right  11/29/2015  CLINICAL DATA:  Status post total knee replacement EXAM: RIGHT KNEE - 1-2 VIEW COMPARISON:  None. FINDINGS: Frontal and lateral views were obtained. The patient is status post total knee replacement right with femoral and tibial prosthetic components appearing well-seated. No fracture or dislocation. Drains are present within the knee joint. Soft tissue air is an  expected postoperative finding. IMPRESSION: Prosthetic components appear well seated. No acute fracture or dislocation. Electronically Signed   By: Bretta Bang III M.D.   On: 11/29/2015 11:54    Disposition: Final discharge disposition not confirmed      Discharge Instructions    Call MD / Call 911    Complete by:  As directed   If you experience chest pain or shortness of breath, CALL 911 and be transported to the hospital emergency room.  If you develope a fever above 101 F, pus (white drainage) or increased drainage or redness at the wound, or calf pain, call your surgeon's office.     Constipation Prevention    Complete by:  As directed   Drink plenty of fluids.  Prune juice may be helpful.  You may use a stool softener, such as Colace (over the counter) 100 mg twice a day.  Use MiraLax (over the counter) for constipation as needed.     Diet general    Complete by:  As directed      Discharge instructions    Complete by:  As directed   Continue 50% partial weightbearing on the right lower extremity 1 month postop. Continue to use TED stockings until follow-up. Patient may remove them at night for sleep. Elevate the right lower extremity whenever possible. Continue to use knee immobilizer at night or when lying in bed or when elevating the operative leg. The patient may remove the knee immobilizer to perform exercises or sit in a chair. Continue using the TENS unit and Polar Care for comfort. Keep incision clean and dry. Cover the right knee  incision during showers with a plastic bag or Saran wrap. Take aspirin 325 mg twice a day for blood clot prevention. Continue to work on knee range of motion exercises at home as instructed by physical therapy. Continue to use a walker for assistance with ambulation until follow-up.  Patient should be see physical therapy in our office later this week.     Do not put a pillow under the knee. Place it under the heel.    Complete by:  As directed      Driving restrictions    Complete by:  As directed   No driving for 1-61 weeks     Lifting restrictions    Complete by:  As directed   No lifting for 12 weeks     Partial weight bearing    Complete by:  As directed      TED hose    Complete by:  As directed   Use stockings (TED hose) for 2 weeks on both leg(s).  You may remove them at night for sleeping.           Follow-up Information    Follow up with [facility] .   Specialty:  Skilled Nursing Facility   Contact information:   [address] [place] [address] [phone]       Signed: Juanell Fairly ,MD 12/03/2015, 1:27 PM

## 2015-12-03 NOTE — Progress Notes (Signed)
Physical Therapy Treatment Patient Details Name: Deborah Reynolds MRN: [id number] DOB: 1956-08-11 Today's Date: 12/03/2015    History of Present Illness This patient is a 60 year old female who came to [facility] for a R TKR 11/29/15    PT Comments    Pt demonstrates limitations at this time due to pain and dizziness in standing. She is still unable to progress her ambulation distance and requires seated rest break initially after beginning ambulation due to increased dizziness. Vitals remain WNL during positional changes and gait without evidence for orthostasis. Recommend pt discharge to SNF due to limitations in ambulation distance, increased pain, limited caregiver support, and dizziness today with position changes. Pt is in agreement to discharge to SNF. Pt will benefit from skilled PT services to address deficits in strength, balance, and mobility in order to return to full function at home.    Follow Up Recommendations  SNF     Equipment Recommendations  Rolling walker with 5" wheels    Recommendations for Other Services       Precautions / Restrictions Precautions Precautions: Knee;Fall Precaution Booklet Issued: Yes (comment) (per PT) Required Braces or Orthoses: Knee Immobilizer - Right Knee Immobilizer - Right: Other (comment);Discontinue once straight leg raise with < 10 degree lag (on when standing/walking) Restrictions Weight Bearing Restrictions: Yes RLE Weight Bearing: Partial weight bearing    Mobility  Bed Mobility Overal bed mobility: Needs Assistance Bed Mobility: Sit to Supine       Sit to supine: Min assist   General bed mobility comments: Pt provided cues to utilize LLE to assist RLE when returning to bed. Requires HOB flat and use of rails to scoot toward top of bed. MinA for RLE  Transfers Overall transfer level: Needs assistance Equipment used: Rolling walker (2 wheeled) Transfers: Sit to/from Stand Sit to Stand: Min guard         General transfer  comment: KI off for all transfers. Pt demonstrates safe hand placement, good weight shift and ability to maintain PWB on RLE. No evidence for instability noted. Pt does take slight increase in time due to pain. In standing pt reports dizziness but resolves slowly. BP does not drop when rechecked  Ambulation/Gait Ambulation/Gait assistance: Min guard Ambulation Distance (Feet): 150 Feet Assistive device: Rolling walker (2 wheeled) Gait Pattern/deviations: Step-to pattern;Decreased step length - left;Decreased weight shift to right Gait velocity: Decreased   General Gait Details: Pt ambulates most of the way to the RN station and back to bed with chair follow due to dizziness in standing. BP checked and WNL and not orthostatic from seated measurements. Pt reports increase in pain with gait. Cues for upright posture and to maintain PWB on RLE. KI doffed for all ambulation   Stairs            Wheelchair Mobility    Modified Rankin (Stroke Patients Only)       Balance Overall balance assessment: Needs assistance Sitting-balance support: Feet supported Sitting balance-Leahy Scale: Normal     Standing balance support: No upper extremity supported Standing balance-Leahy Scale: Fair                      Cognition Arousal/Alertness: Awake/alert Behavior During Therapy: WFL for tasks assessed/performed Overall Cognitive Status: Within Functional Limits for tasks assessed                      Exercises Total Joint Exercises Towel Squeeze: Strengthening;Both;10 reps;Supine Heel Slides: Both;10 reps;Right;Strengthening  Hip ABduction/ADduction: Right;10 reps;Strengthening;Supine Straight Leg Raises: Right;10 reps;Strengthening;Supine Goniometric ROM: Extension not measured at this time due to pain. Flexion is 69 degrees AAROM but limited due to pain    General Comments        Pertinent Vitals/Pain Pain Assessment: 0-10 Pain Score: 8  Pain Location: "my whole  right leg." Pain Descriptors / Indicators: Aching Pain Intervention(s): Monitored during session;Premedicated before session    Home Living                      Prior Function            PT Goals (current goals can now be found in the care plan section) Acute Rehab PT Goals Patient Stated Goal: reduced pain PT Goal Formulation: With patient Time For Goal Achievement: 12/13/15 Potential to Achieve Goals: Good Progress towards PT goals: Progressing toward goals    Frequency  BID    PT Plan Current plan remains appropriate    Co-evaluation             End of Session Equipment Utilized During Treatment: Gait belt Activity Tolerance: Patient limited by fatigue;Patient limited by pain Patient left: in bed;with call bell/phone within reach;with bed alarm set (towel roll under heel, polar care in place)     Time: 6789-3810 PT Time Calculation (min) (ACUTE ONLY): 33 min  Charges:  $Gait Training: 8-22 mins $Therapeutic Exercise: 8-22 mins                    G Codes:      Sharalyn Ink Euan Wandler PT, DPT   Lyndie Vanderloop 12/03/2015, 11:45 AM

## 2015-12-03 NOTE — Discharge Instructions (Signed)
Continue 50% partial weightbearing on the right lower extremity 1 month postop. Continue to use TED stockings until follow-up. Patient may remove them at night for sleep. Elevate the right lower extremity whenever possible. Continue to use knee immobilizer at night or when lying in bed or when elevating the operative leg. The patient may remove the knee immobilizer to perform exercises or sit in a chair. Continue using the TENS unit and Polar Care for comfort. Keep incision clean and dry. Cover the right knee incision during showers with a plastic bag or Saran wrap. Take aspirin 325 mg by mouth twice a day for blood clot prevention. Continue to work on knee range of motion exercises at home as instructed by physical therapy. Continue to use a walker for assistance with ambulation until follow-up.

## 2015-12-03 NOTE — Progress Notes (Signed)
Occupational Therapy Treatment Patient Details Name: Deborah Reynolds MRN: [id number] DOB: 11-Apr-1956 Today's Date: 12/03/2015    History of present illness This patient is a 60 year old female who came to [facility] for a R TKR 11/29/15   OT comments  Patient still needs cues for lower body dressing techniques.  Follow Up Recommendations  SNF    Equipment Recommendations       Recommendations for Other Services      Precautions / Restrictions Precautions Precautions: Knee;Fall Precaution Booklet Issued: Yes (comment) (per PT) Required Braces or Orthoses: Knee Immobilizer - Right Knee Immobilizer - Right: Other (comment);Discontinue once straight leg raise with < 10 degree lag (on when standing/walking) Restrictions Weight Bearing Restrictions: Yes RLE Weight Bearing: Partial weight bearing       Mobility Bed Mobility         Transfers              Balance                               ADL  Patient did not have any pants, but willing to practice lower body dressing techniques. Patient donned doffed socks, and techniques for donning and doffing pants. Patient still needs cues for technique using hip kit. Gave written list of vendors.                                              Vision                     Perception     Praxis      Cognition   Behavior During Therapy: WFL for tasks assessed/performed Overall Cognitive Status: Within Functional Limits for tasks assessed                       Extremity/Trunk Assessment               Exercises   Shoulder Instructions       General Comments      Pertinent Vitals/ Pain          Home Living                                          Prior Functioning/Environment              Frequency       Progress Toward Goals  OT Goals(current goals can now be found in the care plan section)     Acute Rehab OT Goals Patient Stated  Goal: reduced pain  Plan      Co-evaluation                 End of Session Equipment Utilized During Treatment:  (Hip kit)   Activity Tolerance     Patient Left in bed;with call bell/phone within reach;with bed alarm set   Nurse Communication          Time: 1100-1115 OT Time Calculation (min): 15 min  Charges: OT General Charges $OT Visit: 1 Procedure OT Treatments $Self Care/Home Management : 8-22 mins Sharon Mt, MS/OTR/L  Sharon Mt 12/03/2015, 11:42 AM

## 2015-12-03 NOTE — Progress Notes (Signed)
Per MD patient will D/C home today. RN Case Manager aware of above. Bundle Case Manager is aware of above. Please reconsult if future social work needs arise. CSW signing off.   Jetta Lout, LCSW [phone]

## 2015-12-05 NOTE — Addendum Note (Signed)
Addendum  created 12/05/15 1008 by Yves Dill, MD   Modules edited: Anesthesia Attestations

## 2015-12-06 ENCOUNTER — Ambulatory Visit: Payer: Managed Care, Other (non HMO)

## 2016-01-02 ENCOUNTER — Ambulatory Visit: Payer: Managed Care, Other (non HMO) | Attending: Internal Medicine

## 2016-04-19 IMAGING — CR DG KNEE 1-2V*R*
1 series · 2 of 2 positions shown · non-contrast
Comparison: None.

CLINICAL DATA: Status post total knee replacement

EXAM:
RIGHT KNEE - 1-2 VIEW

[Series 1: ap · 0.17mm/px · 2 of 2 slices shown]
[im 1/2]
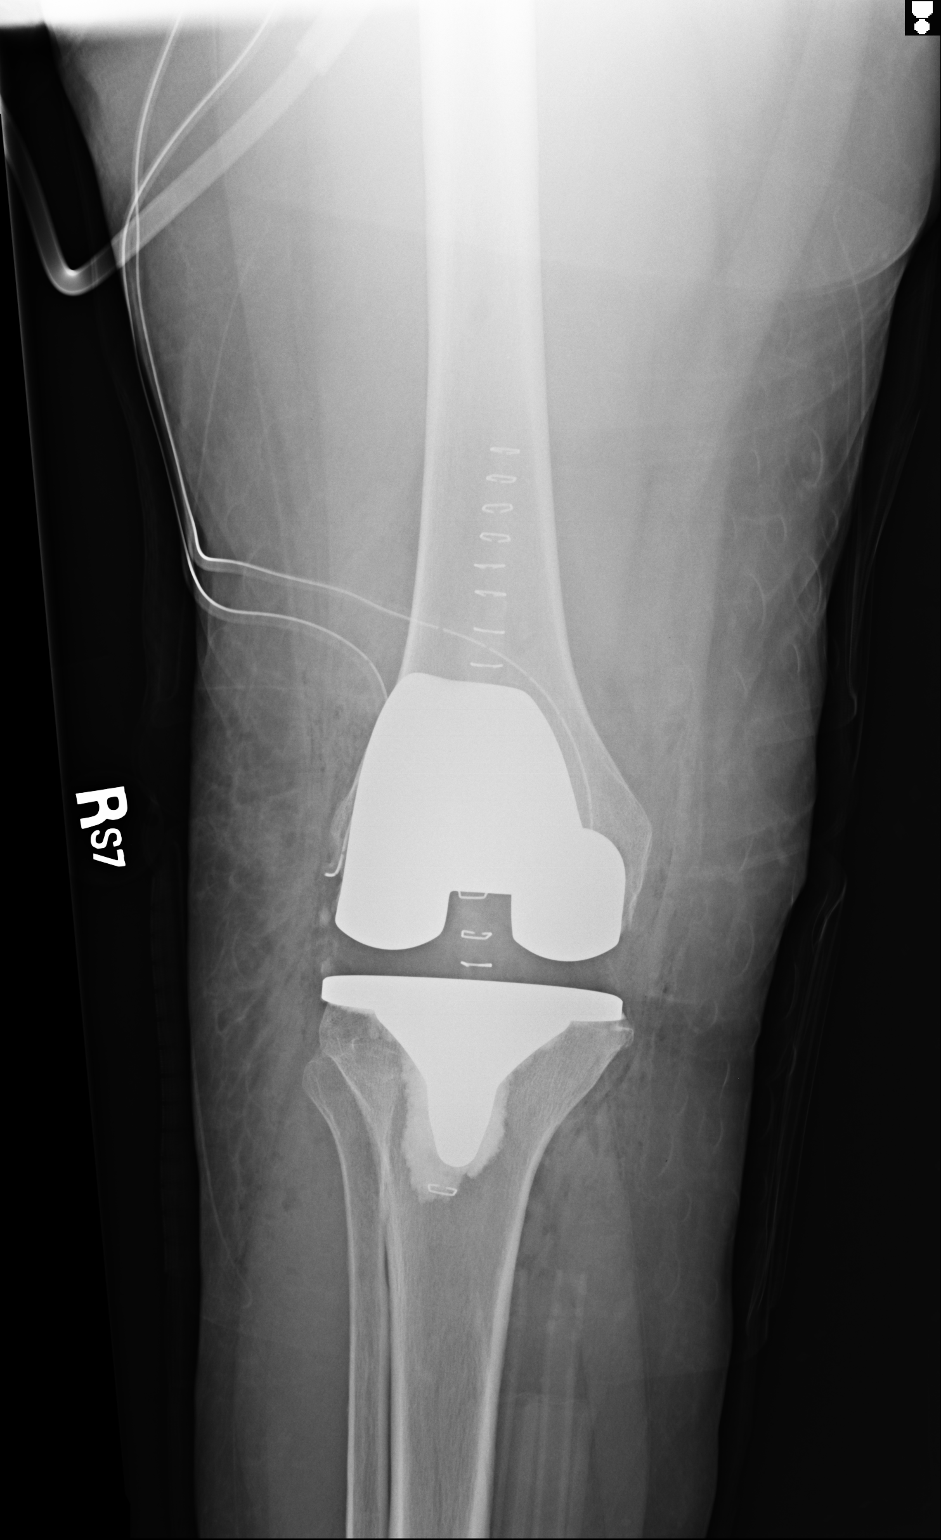
[im 2/2]
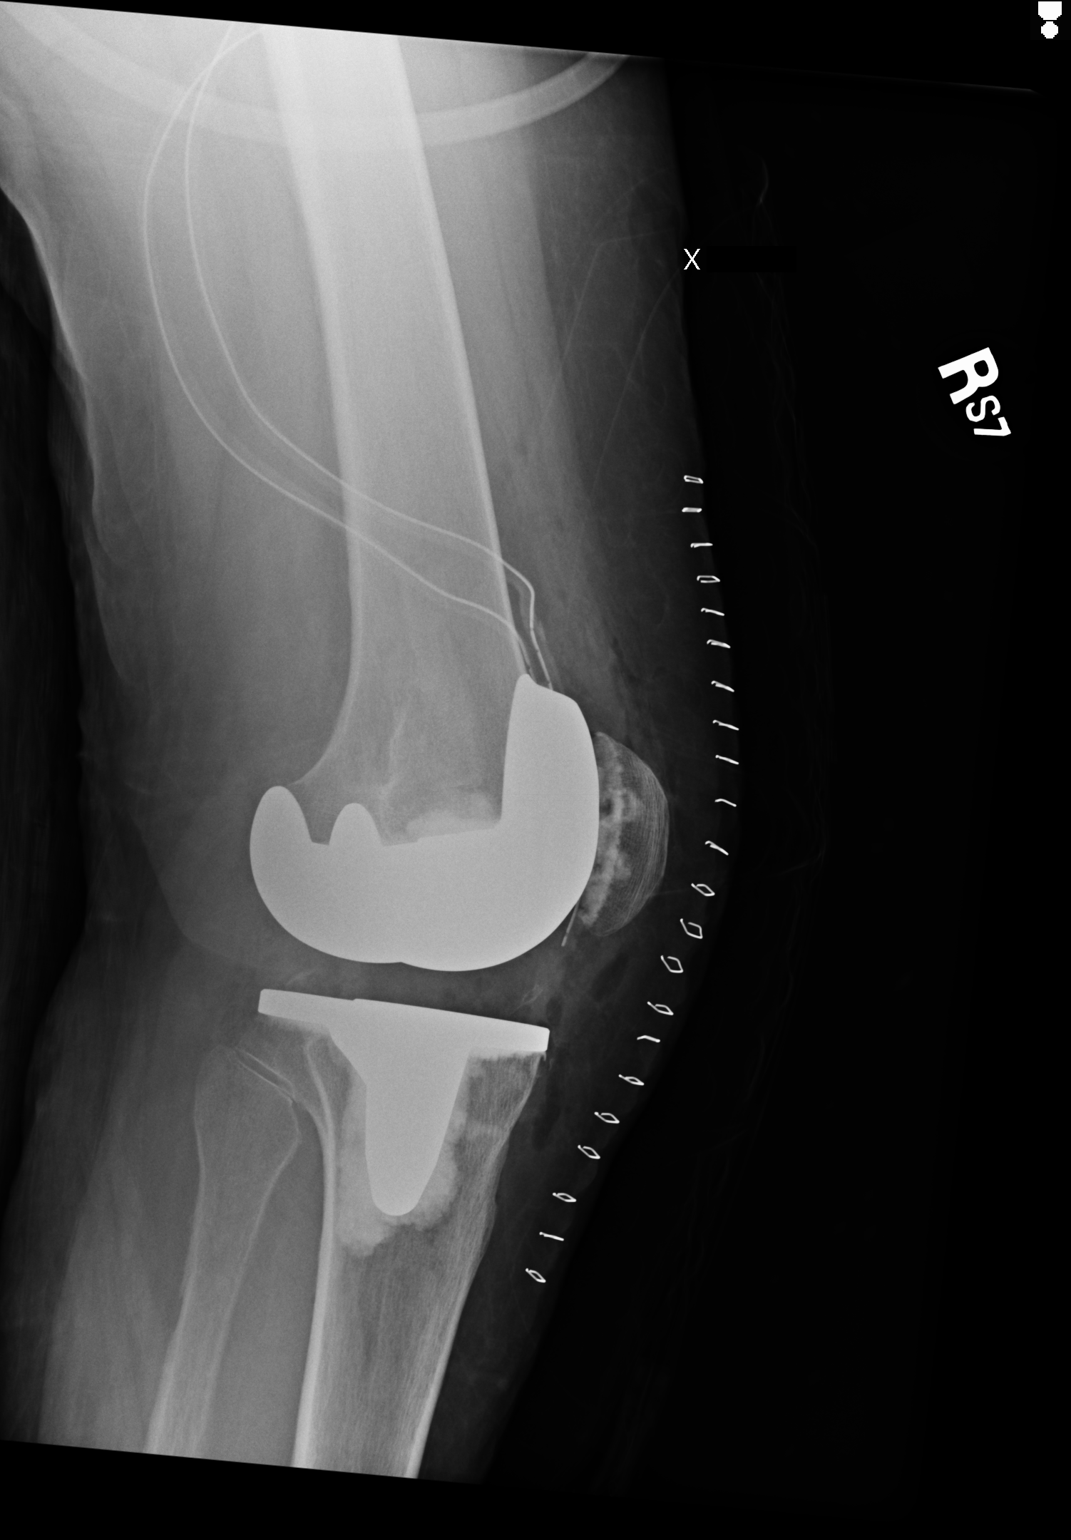

[2 of 2 positions shown; findings below may reference images not displayed]

FINDINGS: Frontal and lateral views were obtained. The patient is status post
total knee replacement right with femoral and tibial prosthetic
components appearing well-seated. No fracture or dislocation. Drains
are present within the knee joint. Soft tissue air is an expected
postoperative finding.
IMPRESSION: Prosthetic components appear well seated. No acute fracture or
dislocation.

## 2019-03-22 ENCOUNTER — Encounter: Admit: 2019-03-22 | Discharge: 2019-03-22 | Payer: PRIVATE HEALTH INSURANCE | Attending: Orthopaedic Surgery

## 2019-05-03 ENCOUNTER — Encounter: Admit: 2019-05-03 | Discharge: 2019-05-03 | Payer: PRIVATE HEALTH INSURANCE | Attending: Orthopaedic Surgery

## 2019-10-18 ENCOUNTER — Encounter: Attending: Orthopaedic Surgery

## 2019-10-19 ENCOUNTER — Encounter: Attending: Orthopaedic Surgery

## 2019-10-19 ENCOUNTER — Encounter

## 2020-05-01 ENCOUNTER — Encounter: Attending: Orthopaedic Surgery

## 2020-05-02 ENCOUNTER — Encounter: Attending: Orthopaedic Surgery

## 2020-06-04 ENCOUNTER — Encounter

## 2020-06-04 NOTE — Progress Notes (Signed)
xr kn 

## 2020-06-06 ENCOUNTER — Encounter: Attending: Orthopaedic Surgery
